# Patient Record
Sex: Male | Born: 1970 | Race: White | Hispanic: No | Marital: Single | Smoking: Current every day smoker
Health system: Southern US, Community
[De-identification: ages and names within clinical notes are randomized; demographics above are authoritative.]

## PROBLEM LIST (undated history)

## (undated) DIAGNOSIS — I10 Essential (primary) hypertension: Secondary | ICD-10-CM

## (undated) DIAGNOSIS — E7800 Pure hypercholesterolemia, unspecified: Secondary | ICD-10-CM

## (undated) DIAGNOSIS — M25559 Pain in unspecified hip: Secondary | ICD-10-CM

## (undated) HISTORY — PX: CHEST SURGERY: SHX595

---

## 2005-07-18 ENCOUNTER — Emergency Department: Payer: Self-pay | Admitting: Unknown Physician Specialty

## 2008-10-07 ENCOUNTER — Emergency Department: Payer: Self-pay | Admitting: Emergency Medicine

## 2009-04-06 ENCOUNTER — Emergency Department (HOSPITAL_BASED_OUTPATIENT_CLINIC_OR_DEPARTMENT_OTHER): Admission: EM | Admit: 2009-04-06 | Discharge: 2009-04-06 | Payer: Self-pay | Admitting: Emergency Medicine

## 2009-12-07 ENCOUNTER — Emergency Department: Payer: Self-pay | Admitting: Emergency Medicine

## 2011-04-03 ENCOUNTER — Emergency Department: Payer: Self-pay | Admitting: Emergency Medicine

## 2011-06-02 ENCOUNTER — Emergency Department: Payer: Self-pay | Admitting: Emergency Medicine

## 2015-01-01 ENCOUNTER — Ambulatory Visit (INDEPENDENT_AMBULATORY_CARE_PROVIDER_SITE_OTHER): Payer: BLUE CROSS/BLUE SHIELD

## 2015-01-01 ENCOUNTER — Ambulatory Visit
Admission: EM | Admit: 2015-01-01 | Discharge: 2015-01-01 | Disposition: A | Discharge status: Home / Self Care | Payer: BLUE CROSS/BLUE SHIELD | Attending: Emergency Medicine | Admitting: Emergency Medicine

## 2015-01-01 DIAGNOSIS — M25551 Pain in right hip: Secondary | ICD-10-CM | POA: Diagnosis not present

## 2015-01-01 MED ORDER — PREDNISONE 50 MG PO TABS: 50.0000 mg | ORAL_TABLET | Freq: Every day | ORAL | Status: DC

## 2015-01-01 NOTE — ED Notes (Signed)
C/o right posterior hip pain constant x 1 week. Non radiating. Denies trauma

## 2015-01-01 NOTE — ED Provider Notes (Signed)
CSN: [id number]     Arrival date & time 01/01/15  1624 History   None    Chief Complaint  Patient presents with  . Hip Pain   (Consider location/radiation/quality/duration/timing/severity/associated sxs/prior Treatment) HPI Onset of right hip pain for about 1 week no known injury. 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 No pertinent past medical history. Past Surgical History  Procedure Laterality Date  . Hernia repair     Family History  Problem Relation Age of Onset  . Heart failure Mother   . Cancer Father    Social History  Substance Use Topics  . Smoking status: Current Every Day Smoker -- 0.50 packs/day    Types: Cigarettes  . Smokeless tobacco: None  . Alcohol Use: Yes     Comment: socially    Review of Systems +'ve right hip pain Denies: numbness, weakness Allergies  Erythromycin  Home Medications   Prior to Admission medications   Not on File   Meds Ordered and Administered this Visit  Medications - No data to display  BP 129/91 mmHg  Pulse 92  Temp(Src) 97.3 F (36.3 C) (Tympanic)  Resp 16  Ht 5\' 10"  (1.778 m)  Wt 173 lb (78.472 kg)  BMI 24.82 kg/m2  SpO2 100% No data found.   Physical Exam  Constitutional: He appears well-developed and well-nourished.  HENT:  Head: Normocephalic and atraumatic.  Pulmonary/Chest: Effort normal.  Musculoskeletal:       Right hip: He exhibits decreased range of motion.       Legs: Neurological: He is alert.    ED Course  Procedures (including critical care time)  Labs Review Labs Reviewed - No data to display  Imaging Review Dg Hip Unilat With Pelvis 2-3 Views Right  01/01/2015  CLINICAL DATA:  Right posterior hip pain for 1 week EXAM: DG HIP (WITH OR WITHOUT PELVIS) 2-3V RIGHT COMPARISON:  None. FINDINGS: There is no evidence of hip fracture or dislocation. There is no evidence of arthropathy or other focal bone abnormality. IMPRESSION: No acute osseous injury of the right hip.  Electronically Signed   By: Elige KoHetal  Patel   On: 01/01/2015 18:06     Visual Acuity Review  Right Eye Distance:   Left Eye Distance:   Bilateral Distance:    Right Eye Near:   Left Eye Near:    Bilateral Near:         MDM   1. Hip pain, acute, right    Treat with antiinflammatory  Follow up with his doctor  Return to work Monday    Tharon AquasFrank C Mordche Hedglin, [place] 01/01/15 2031

## 2015-01-01 NOTE — Discharge Instructions (Signed)
Heat Therapy °Heat therapy can help ease sore, stiff, injured, and tight muscles and joints. Heat relaxes your muscles, which may help ease your pain. Heat therapy should only be used on old, pre-existing, or long-lasting (chronic) injuries. Do not use heat therapy unless told by your doctor. °HOW TO USE HEAT THERAPY °There are several different kinds of heat therapy, including: °· Moist heat pack. °· Warm water bath. °· Hot water bottle. °· Electric heating pad. °· Heated gel pack. °· Heated wrap. °· Electric heating pad. °GENERAL HEAT THERAPY RECOMMENDATIONS  °· Do not sleep while using heat therapy. Only use heat therapy while you are awake. °· Your skin may turn pink while using heat therapy. Do not use heat therapy if your skin turns red. °· Do not use heat therapy if you have new pain. °· High heat or long exposure to heat can cause burns. Be careful when using heat therapy to avoid burning your skin. °· Do not use heat therapy on areas of your skin that are already irritated, such as with a rash or sunburn. °GET HELP IF:  °· You have blisters, redness, swelling (puffiness), or numbness. °· You have new pain. °· Your pain is worse. °MAKE SURE YOU: °· Understand these instructions. °· Will watch your condition. °· Will get help right away if you are not doing well or get worse. °  °This information is not intended to replace advice given to you by your health care provider. Make sure you discuss any questions you have with your health care provider. °  °Document Released: 04/11/2011 Document Revised: 02/07/2014 Document Reviewed: 03/12/2013 °Elsevier Interactive Patient Education ©2016 [employer]vier Inc. ° °

## 2015-01-06 ENCOUNTER — Ambulatory Visit
Admission: EM | Admit: 2015-01-06 | Discharge: 2015-01-06 | Disposition: A | Discharge status: Home / Self Care | Payer: BLUE CROSS/BLUE SHIELD | Attending: Family Medicine | Admitting: Family Medicine

## 2015-01-06 ENCOUNTER — Encounter: Payer: Self-pay | Admitting: Emergency Medicine

## 2015-01-06 DIAGNOSIS — T889XXA Complication of surgical and medical care, unspecified, initial encounter: Secondary | ICD-10-CM | POA: Diagnosis not present

## 2015-01-06 DIAGNOSIS — R110 Nausea: Secondary | ICD-10-CM

## 2015-01-06 DIAGNOSIS — R062 Wheezing: Secondary | ICD-10-CM | POA: Diagnosis not present

## 2015-01-06 DIAGNOSIS — R0602 Shortness of breath: Secondary | ICD-10-CM | POA: Diagnosis not present

## 2015-01-06 DIAGNOSIS — M25551 Pain in right hip: Secondary | ICD-10-CM

## 2015-01-06 LAB — BASIC METABOLIC PANEL
ANION GAP: 9 (ref 5–15)
BUN: 7 mg/dL (ref 6–20)
CO2: 28 mmol/L (ref 22–32)
Calcium: 9.7 mg/dL (ref 8.9–10.3)
Chloride: 99 mmol/L — ABNORMAL LOW (ref 101–111)
Creatinine, Ser: 0.77 mg/dL (ref 0.61–1.24)
GFR calc Af Amer: >60 mL/min (ref >60)
GLUCOSE: 89 mg/dL (ref 65–99)
POTASSIUM: 3.7 mmol/L (ref 3.5–5.1)
Sodium: 136 mmol/L (ref 135–145)

## 2015-01-06 MED ORDER — IPRATROPIUM-ALBUTEROL 0.5-2.5 (3) MG/3ML IN SOLN
3.0000 mL | Freq: Once | RESPIRATORY_TRACT | Status: AC
  Administered 2015-01-06: 3 mL via RESPIRATORY_TRACT

## 2015-01-06 MED ORDER — ONDANSETRON 8 MG PO TBDP: 8.0000 mg | ORAL_TABLET | Freq: Two times a day (BID) | ORAL | Status: DC

## 2015-01-06 MED ORDER — HYDROCODONE-ACETAMINOPHEN 5-325 MG PO TABS: 1.0000 | ORAL_TABLET | Freq: Four times a day (QID) | ORAL | Status: DC | PRN

## 2015-01-06 NOTE — ED Notes (Signed)
Patient c/o difficulty breathing that started couple of days ago that has gotten worse after starting Prednisone.

## 2015-01-06 NOTE — ED Provider Notes (Signed)
CSN: [id number]     Arrival date & time 01/06/15  1829 History   First MD Initiated Contact with Patient 01/06/15 1840     Chief Complaint  Patient presents with  . Shortness of Breath  . Medication Reaction   (Consider location/radiation/quality/duration/timing/severity/associated sxs/prior Treatment) HPI Comments: 44 yo male with a c/o a 4 days h/o fatigue, insomnia, nausea, shortness of breath since starting prednisone 50mg  daily for right hip pain. States took last dose of prednisone today in the am. Denies any rash or swelling.   The history is provided by the patient.    History reviewed. No pertinent past medical history. Past Surgical History  Procedure Laterality Date  . Hernia repair     Family History  Problem Relation Age of Onset  . Heart failure Mother   . Cancer Father    Social History  Substance Use Topics  . Smoking status: Current Every Day Smoker -- 0.50 packs/day    Types: Cigarettes  . Smokeless tobacco: None  . Alcohol Use: Yes     Comment: socially    Review of Systems  Allergies  Erythromycin  Home Medications   Prior to Admission medications   Medication Sig Start Date End Date Taking? Authorizing Provider  HYDROcodone-acetaminophen (NORCO/VICODIN) 5-325 MG tablet Take 1 tablet by mouth every 6 (six) hours as needed. 01/06/15   Payton Mccallumrlando Niclas Markell, MD  ondansetron (ZOFRAN ODT) 8 MG disintegrating tablet Take 1 tablet (8 mg total) by mouth 2 (two) times daily. 01/06/15   Payton Mccallumrlando Makeda Peeks, MD  predniSONE (DELTASONE) 50 MG tablet Take 1 tablet (50 mg total) by mouth daily. 01/01/15   Tharon AquasFrank C Patrick, PA   Meds Ordered and Administered this Visit   Medications  ipratropium-albuterol (DUONEB) 0.5-2.5 (3) MG/3ML nebulizer solution 3 mL (3 mLs Nebulization Given 01/06/15 1908)    BP 137/95 mmHg  Pulse 93  Temp(Src) 97.7 F (36.5 C) (Tympanic)  Resp 16  Ht 5\' 10"  (1.778 m)  Wt 173 lb (78.472 kg)  BMI 24.82 kg/m2  SpO2 99% No data found.   Physical  Exam  Constitutional: He appears well-developed and well-nourished. No distress.  HENT:  Head: Normocephalic and atraumatic.  Right Ear: Tympanic membrane, external ear and ear canal normal.  Left Ear: Tympanic membrane, external ear and ear canal normal.  Nose: Nose normal.  Mouth/Throat: Uvula is midline, oropharynx is clear and moist and mucous membranes are normal. No oropharyngeal exudate or tonsillar abscesses.  Eyes: Conjunctivae and EOM are normal. Pupils are equal, round, and reactive to light. Right eye exhibits no discharge. Left eye exhibits no discharge. No scleral icterus.  Neck: Normal range of motion. Neck supple. No tracheal deviation present. No thyromegaly present.  Cardiovascular: Normal rate, regular rhythm and normal heart sounds.   Pulmonary/Chest: Effort normal. No stridor. No respiratory distress. He has wheezes (few expiratory wheezes). He has no rales. He exhibits no tenderness.  Musculoskeletal: He exhibits no edema.  Lymphadenopathy:    He has no cervical adenopathy.  Neurological: He is alert.  Skin: Skin is warm and dry. No rash noted. He is not diaphoretic.  Nursing note and vitals reviewed.   ED Course  Procedures (including critical care time)  Labs Review Labs Reviewed  BASIC METABOLIC PANEL - Abnormal; Notable for the following:    Chloride 99 (*)    All other components within normal limits    Imaging Review No results found.   Visual Acuity Review  Right Eye Distance:   Left Eye  Distance:   Bilateral Distance:    Right Eye Near:   Left Eye Near:    Bilateral Near:      EKG: normal EKG, normal sinus rhythm, there are no previous tracings available for comparison; reviewed by me and agree with readout.  MDM   1. Nausea   2. Side effects of treatment, initial encounter   3. Hip pain, right    Discharge Medication List as of 01/06/2015  7:55 PM    START taking these medications   Details  HYDROcodone-acetaminophen  (NORCO/VICODIN) 5-325 MG tablet Take 1 tablet by mouth every 6 (six) hours as needed., Starting 01/06/2015, Until Discontinued, Print    ondansetron (ZOFRAN ODT) 8 MG disintegrating tablet Take 1 tablet (8 mg total) by mouth 2 (two) times daily., Starting 01/06/2015, Until Discontinued, Normal       1. Lab, EKG results and diagnosis reviewed with patient  2. Patient given Duoneb treatment with improvement of symptoms 3. rx as per orders above; reviewed possible side effects, interactions, risks and benefits  4. Recommend supportive treatment with increased fluids; stop prednisone 5. Follow-up prn if symptoms worsen or don't improve    Payton Mccallum, MD 01/06/15 2028

## 2015-04-06 ENCOUNTER — Ambulatory Visit
Admission: EM | Admit: 2015-04-06 | Discharge: 2015-04-06 | Disposition: A | Discharge status: Home / Self Care | Payer: BLUE CROSS/BLUE SHIELD | Attending: Family Medicine | Admitting: Family Medicine

## 2015-04-06 DIAGNOSIS — K529 Noninfective gastroenteritis and colitis, unspecified: Secondary | ICD-10-CM | POA: Diagnosis not present

## 2015-04-06 HISTORY — DX: Pain in unspecified hip: M25.559

## 2015-04-06 LAB — COMPREHENSIVE METABOLIC PANEL
ALT: 39 U/L (ref 17–63)
AST: 28 U/L (ref 15–41)
Albumin: 4 g/dL (ref 3.5–5.0)
Alkaline Phosphatase: 111 U/L (ref 38–126)
Anion gap: 6 (ref 5–15)
BUN: 7 mg/dL (ref 6–20)
CHLORIDE: 102 mmol/L (ref 101–111)
CO2: 26 mmol/L (ref 22–32)
CREATININE: 0.73 mg/dL (ref 0.61–1.24)
Calcium: 8.4 mg/dL — ABNORMAL LOW (ref 8.9–10.3)
GFR calc Af Amer: >60 mL/min (ref >60)
GFR calc non Af Amer: >60 mL/min (ref >60)
Glucose, Bld: 97 mg/dL (ref 65–99)
POTASSIUM: 3.8 mmol/L (ref 3.5–5.1)
SODIUM: 134 mmol/L — AB (ref 135–145)
Total Bilirubin: 0.6 mg/dL (ref 0.3–1.2)
Total Protein: 6.8 g/dL (ref 6.5–8.1)

## 2015-04-06 LAB — CBC WITH DIFFERENTIAL/PLATELET
BASOS ABS: 0 10*3/uL (ref 0–0.1)
BASOS PCT: 0 %
EOS ABS: 0.2 10*3/uL (ref 0–0.7)
EOS PCT: 3 %
HCT: 43 % (ref 40.0–52.0)
Hemoglobin: 14.5 g/dL (ref 13.0–18.0)
Lymphocytes Relative: 36 %
Lymphs Abs: 2.5 10*3/uL (ref 1.0–3.6)
MCH: 30.5 pg (ref 26.0–34.0)
MCHC: 33.8 g/dL (ref 32.0–36.0)
MCV: 90.2 fL (ref 80.0–100.0)
Monocytes Absolute: 0.6 10*3/uL (ref 0.2–1.0)
Monocytes Relative: 9 %
Neutro Abs: 3.7 10*3/uL (ref 1.4–6.5)
Neutrophils Relative %: 52 %
PLATELETS: 238 10*3/uL (ref 150–440)
RBC: 4.76 MIL/uL (ref 4.40–5.90)
RDW: 13.5 % (ref 11.5–14.5)
WBC: 7 10*3/uL (ref 3.8–10.6)

## 2015-04-06 MED ORDER — ONDANSETRON 8 MG PO TBDP: 8.0000 mg | ORAL_TABLET | Freq: Three times a day (TID) | ORAL | Status: DC | PRN

## 2015-04-06 NOTE — ED Notes (Signed)
Started Saturday afternoon "feeling crampy". + diarrhea started. + nausea but no vomiting. Also states has pain under umbilical area and "my whole body hurts"

## 2015-04-06 NOTE — ED Provider Notes (Signed)
CSN: [id number]     Arrival date & time 04/06/15  1044 History   First MD Initiated Contact with Patient 04/06/15 1128     Chief Complaint  Patient presents with  . Diarrhea  . Abdominal Pain   (Consider location/radiation/quality/duration/timing/severity/associated sxs/prior Treatment) HPI Comments: 45 yo male with a 2 days h/o crampy abdominal pains, diarrhea and nausea. Denies any vomiting, fevers, chills, melena, hematochezia, urinary symptoms, suspicious food intake or known sick contacts.   Patient is a 45 y.o. male presenting with diarrhea and abdominal pain. The history is provided by the patient.  Diarrhea Associated symptoms: abdominal pain   Abdominal Pain Associated symptoms: diarrhea     Past Medical History  Diagnosis Date  . Hip pain     right   Past Surgical History  Procedure Laterality Date  . Hernia repair     Family History  Problem Relation Age of Onset  . Heart failure Mother   . Cancer Father    Social History  Substance Use Topics  . Smoking status: Current Some Day Smoker -- 0.50 packs/day    Types: Cigarettes  . Smokeless tobacco: None  . Alcohol Use: Yes     Comment: socially    Review of Systems  Gastrointestinal: Positive for abdominal pain and diarrhea.    Allergies  Prednisone and Erythromycin  Home Medications   Prior to Admission medications   Medication Sig Start Date End Date Taking? Authorizing Provider  HYDROcodone-acetaminophen (NORCO/VICODIN) 5-325 MG tablet Take 1 tablet by mouth every 6 (six) hours as needed. 01/06/15   Norval Gable, MD  ondansetron (ZOFRAN ODT) 8 MG disintegrating tablet Take 1 tablet (8 mg total) by mouth every 8 (eight) hours as needed for nausea or vomiting. 04/06/15   Norval Gable, MD  predniSONE (DELTASONE) 50 MG tablet Take 1 tablet (50 mg total) by mouth daily. 01/01/15   Konrad Felix, PA   Meds Ordered and Administered this Visit  Medications - No data to display  BP 129/85 mmHg  Pulse 70   Temp(Src) 98 F (36.7 C) (Oral)  Resp 16  Ht _0  (1.778 m)  Wt 170 lb (77.111 kg)  BMI 24.39 kg/m2  SpO2 100% Orthostatic VS for the past 24 hrs:  BP- Lying Pulse- Lying BP- Sitting Pulse- Sitting BP- Standing at 0 minutes Pulse- Standing at 0 minutes  04/06/15 1108 129/85 mmHg 70 119/85 mmHg 70 121/86 mmHg 80    Physical Exam  Constitutional: He appears well-developed and well-nourished. No distress.  Cardiovascular: Normal rate, regular rhythm and normal heart sounds.   Pulmonary/Chest: Effort normal and breath sounds normal. No respiratory distress. He has no wheezes. He has no rales.  Abdominal: Soft. Bowel sounds are normal. He exhibits no distension and no mass. There is tenderness (mild diffuse tenderness to palpation; no rebound or guarding). There is no rebound and no guarding.  Skin: No rash noted. He is not diaphoretic.  Nursing note and vitals reviewed.   ED Course  Procedures (including critical care time)  Labs Review Labs Reviewed  COMPREHENSIVE METABOLIC PANEL - Abnormal; Notable for the following:    Sodium 134 (*)    Calcium 8.4 (*)    All other components within normal limits  GASTROINTESTINAL PANEL BY PCR, STOOL (REPLACES STOOL CULTURE)  CBC WITH DIFFERENTIAL/PLATELET    Imaging Review No results found.   Visual Acuity Review  Right Eye Distance:   Left Eye Distance:   Bilateral Distance:    Right Eye Near:  Left Eye Near:    Bilateral Near:         MDM   1. Acute gastroenteritis   (likely viral)    Discharge Medication List as of 04/06/2015 12:41 PM     Discharge Medication List as of 04/06/2015 12:41 PM     1. Lab results and diagnosis reviewed with patient; patient tolerating po fluids prior to discharge 2. rx as per orders above; reviewed possible side effects, interactions, risks and benefits; rx printed for zofran as per orders 3. Recommend supportive treatment with increased fluids/clear liquids, then advance slowly as  tolerated 4. Check stool sample (patient given kit to bring back samples) 5. Follow-up prn if symptoms worsen or don't improve   Norval Gable, MD 04/06/15 1614

## 2015-08-11 ENCOUNTER — Ambulatory Visit (INDEPENDENT_AMBULATORY_CARE_PROVIDER_SITE_OTHER): Payer: BLUE CROSS/BLUE SHIELD

## 2015-08-11 ENCOUNTER — Ambulatory Visit (INDEPENDENT_AMBULATORY_CARE_PROVIDER_SITE_OTHER): Payer: BLUE CROSS/BLUE SHIELD | Admitting: Family Medicine

## 2015-08-11 ENCOUNTER — Encounter: Payer: Self-pay | Admitting: Family Medicine

## 2015-08-11 VITALS — BP 122/78 | HR 75 | Temp 98.2°F | Wt 185.0 lb

## 2015-08-11 DIAGNOSIS — M25511 Pain in right shoulder: Secondary | ICD-10-CM

## 2015-08-11 MED ORDER — DICLOFENAC SODIUM 75 MG PO TBEC: 75.0000 mg | DELAYED_RELEASE_TABLET | Freq: Two times a day (BID) | ORAL | Status: DC

## 2015-08-11 NOTE — Progress Notes (Signed)
Subjective:  Patient ID: Corey CowmanWilliam S Kramme, male    DOB: [birth year]  Age: 45 y.o. MRN: [id number]  CC: R shoulder pain  HPI Corey CowmanWilliam S Altman is a 45 y.o. male presents to the clinic today as a new patient with the above complaint.  Right shoulder pain  Patient suffered a fall on Saturday.  Patient was pulled off a deck by his dog.   He subsequently fell down and landed on his right shoulder.  He developed pain as well as tingling in his hand.  [facility]  His emergency department visit was reviewed and is summarized as follows: Patient evaluated and x-rays obtained. Initial x-ray the shoulder was suggestive of an AC joint injury. Given findings on x-ray, orthopedics was consult did and further x-rays were done focusing [facility]. These x-rays were negative. He was discharged home after being placed in a sling and told to follow-up.  Patient presents today with continued complaints of right shoulder pain. Is been taking Tylenol for the pain.  Exacerbated by certain movements.  He has had some relief with Tylenol and rest.  He continues to have numbness in hands.  No other complaints at this time.  PMH, Surgical Hx, Family Hx, Social History reviewed and updated as below.  PMH - None per report.  Past Surgical History  Procedure Laterality Date  . Chest surgery      Reports removal of a "growth" or repair of a hernia in the chest as a child   Family History  Problem Relation Age of Onset  . Heart failure Mother   . Cancer Father   . Diabetes Paternal Grandmother    Social History  Substance Use Topics  . Smoking status: Current Every Day Smoker -- 0.50 packs/day    Types: Cigarettes  . Smokeless tobacco: Not on file  . Alcohol Use: 0.0 oz/week    0 Standard drinks or equivalent per week     Comment: 1x/month   Review of Systems  Musculoskeletal:       R shoulder pain.  All other systems reviewed and are negative.  Objective:   Today's  Vitals: BP 122/78 mmHg  Pulse 75  Temp(Src) 98.2 F (36.8 C) (Oral)  Wt 185 lb (83.915 kg)  SpO2 97%  Physical Exam  Constitutional: He is oriented to person, place, and time. He appears well-developed. No distress.  HENT:  Mouth/Throat: Oropharynx is clear and moist.  Poor dentition. Numerous caries and missing teeth.  Eyes: Conjunctivae are normal. Scleral icterus is present.  Neck: Normal range of motion.  Cardiovascular: Normal rate and regular rhythm.   Pulmonary/Chest: Effort normal. He has no wheezes. He has no rales.  Abdominal: Soft. He exhibits no distension. There is no tenderness.  Musculoskeletal:  Shoulder: Inspection reveals no abnormalities, atrophy or asymmetry. Palpation - tenderness over AC joint.  Decreased ROM due to pain.    Neurological: He is alert and oriented to person, place, and time.  Skin: Skin is warm and dry. No rash noted.  Psychiatric: He has a normal mood and affect.  Vitals reviewed.  Assessment & Plan:   Problem List Items Addressed This Visit    Right shoulder pain - Primary    New problem. I discussed this case with a colleague (Dr. Margaretha Sheffieldraper, Sports medicine). Given the initial x-ray results that were [facility] of [facility] and tenderness over the [facility] joint today, will repeat xrays to ensure no occult fracture. Treating  with Diclofenac. Out of work x 1 week. May need more given significant lifting at work.      Relevant Orders   DG Shoulder Right   DG Clavicle Right     Outpatient Encounter Prescriptions as of 08/11/2015  Medication Sig  . diclofenac (VOLTAREN) 75 MG EC tablet Take 1 tablet (75 mg total) by mouth 2 (two) times daily.  . [DISCONTINUED] HYDROcodone-acetaminophen (NORCO/VICODIN) 5-325 MG tablet Take 1 tablet by mouth every 6 (six) hours as needed.  . [DISCONTINUED] ondansetron (ZOFRAN ODT) 8 MG disintegrating tablet Take 1 tablet (8 mg total) by mouth every 8 (eight) hours as needed for nausea or vomiting.  .  [DISCONTINUED] predniSONE (DELTASONE) 50 MG tablet Take 1 tablet (50 mg total) by mouth daily.   No facility-administered encounter medications on file as of 08/11/2015.   Follow-up: PRN  Everlene Other DO [facility]

## 2015-08-11 NOTE — Progress Notes (Signed)
Pre visit review using our clinic review tool, if applicable. No additional management support is needed unless otherwise documented below in the visit note. 

## 2015-08-12 DIAGNOSIS — M25511 Pain in right shoulder: Secondary | ICD-10-CM | POA: Insufficient documentation

## 2015-08-12 NOTE — Assessment & Plan Note (Signed)
New problem. I discussed this case with a colleague (Dr. Margaretha Sheffieldraper, Sports medicine). Given the initial x-ray results that were suggestive of an [facility] joint injury and tenderness over the [facility] joint today, will repeat xrays to ensure no occult fracture. Treating with Diclofenac. Out of work x 1 week. May need more given significant lifting at work.

## 2015-08-13 ENCOUNTER — Encounter: Payer: Self-pay | Admitting: Family Medicine

## 2015-08-13 ENCOUNTER — Telehealth: Payer: Self-pay | Admitting: Family Medicine

## 2015-08-13 ENCOUNTER — Ambulatory Visit (INDEPENDENT_AMBULATORY_CARE_PROVIDER_SITE_OTHER): Payer: BLUE CROSS/BLUE SHIELD | Admitting: Family Medicine

## 2015-08-13 VITALS — BP 122/88 | HR 74 | Temp 98.4°F | Wt 187.4 lb

## 2015-08-13 DIAGNOSIS — K625 Hemorrhage of anus and rectum: Secondary | ICD-10-CM | POA: Diagnosis not present

## 2015-08-13 NOTE — Progress Notes (Signed)
   Subjective:  Patient ID: Corey Barnes, male    DOB: [birth year]  Age: 45 y.o. MRN: [id number]  CC: Blood with wiping  HPI:  45 year old male presents with the above complaints.  Patient was recently seen on 7/11 and was placed on diclofenac. He states that earlier today he noticed some blood with wiping no reports of blood in the toilet. No associated rectal pain. Denies issues with constipation. Has bowel movement every 2 days without significant straining. No prior history of hemorrhoids. Patient states that he "had a decent amount of blood" on the toilet tissue. No associated abdominal pain. No reports of melena. No known inciting factor, although the patient is concerned that this was from the diclofenac. No known exacerbating or relieving factors. He's had no further issues today.  Social Hx   Social History   Social History  . Marital Status: Single    Spouse Name: N/A  . Number of Children: N/A  . Years of Education: N/A   Social History Main Topics  . Smoking status: Current Every Day Smoker -- 0.50 packs/day    Types: Cigarettes  . Smokeless tobacco: None  . Alcohol Use: 0.0 oz/week    0 Standard drinks or equivalent per week     Comment: 1x/month  . Drug Use: No  . Sexual Activity:    Partners: Female     Comment: Girlfriend   Other Topics Concern  . None   Social History Narrative   Review of Systems  Constitutional: Negative.   Gastrointestinal:       Blood with wiping.    Objective:  BP 122/88 mmHg  Pulse 74  Temp(Src) 98.4 F (36.9 C) (Oral)  Wt 187 lb 6 oz (84.993 kg)  SpO2 98%  BP/Weight 08/13/2015 3/90/3009 03/05/3005  Systolic BP 622 633 354  Diastolic BP 88 78 85  Wt. (Lbs) 187.38 185 170  BMI 26.89 26.54 24.39   Physical Exam  Constitutional: He is oriented to person, place, and time. He appears well-developed. No distress.  Abdominal: Soft. He exhibits no distension. There is no tenderness. There is no rebound and no guarding.    Genitourinary: Rectal exam shows no external hemorrhoid, no fissure and no mass.  Neurological: He is alert and oriented to person, place, and time.  Psychiatric: He has a normal mood and affect.  Vitals reviewed.  Lab Results  Component Value Date   WBC 7.0 04/06/2015   HGB 14.5 04/06/2015   HCT 43.0 04/06/2015   PLT 238 04/06/2015   GLUCOSE 97 04/06/2015   ALT 39 04/06/2015   AST 28 04/06/2015   NA 134* 04/06/2015   K 3.8 04/06/2015   CL 102 04/06/2015   CREATININE 0.73 04/06/2015   BUN 7 04/06/2015   CO2 26 04/06/2015   Assessment & Plan:   Problem List Items Addressed This Visit    BRBPR (bright red blood per rectum) - Primary    New problem. 1 episode of blood on toilet tissue. Exam unremarkable. CBC today.  Sending home with FOBT kit. Stopping Diclofenac.      Relevant Orders   CBC   Fecal occult blood, imunochemical     Follow-up: PRN  [place]

## 2015-08-13 NOTE — Telephone Encounter (Signed)
On your scheduled this afternoon, thanks

## 2015-08-13 NOTE — Patient Instructions (Signed)
Stop the medication.  We will call with the results of the lab.  Follow up as needed  Take care  Dr. Adriana Simasook

## 2015-08-13 NOTE — Telephone Encounter (Signed)
Pt was spoke with and PCP has discontinued medication prescribed recently. Pt is still coming in for a visit.

## 2015-08-13 NOTE — Telephone Encounter (Signed)
Patient Name: Gus RankinWILLIAM Wurtzel DOB: 08/29/1970 Initial Comment Caller says he was seen on the 11th, Dx shoulder injury, he noticed he had blood in his stool this morning, which is a known side effect for a new Dilofenac Rx he is taking Nurse Assessment Nurse: Roma KayserForsythe, RN, Santina Evansatherine Date/Time (Eastern Time): 08/13/2015 10:02:48 AM Confirm and document reason for call. If symptomatic, describe symptoms. You must click the next button to save text entered. ---caller states blood in his stool, once , toilet paper had bright red blood. Has the patient traveled out of the country within the last 30 days? ---No Does the patient have any new or worsening symptoms? ---Yes Will a triage be completed? ---Yes Related visit to physician within the last 2 weeks? ---Yes Does the PT have any chronic conditions? (i.e. diabetes, asthma, etc.) ---No Is this a behavioral health or substance abuse call? ---No Guidelines Guideline Title Affirmed Question Affirmed Notes Rectal Bleeding MILD rectal bleeding (more than just a few drops or streaks) Final Disposition User See PCP When Office is Open (within 3 days) Roma KayserForsythe, RN, Walt DisneyCatherine Referrals REFERRED TO PCP OFFICE Disagree/Comply: Danella Maiersomply

## 2015-08-13 NOTE — Progress Notes (Signed)
Pre visit review using our clinic review tool, if applicable. No additional management support is needed unless otherwise documented below in the visit note. 

## 2015-08-13 NOTE — Assessment & Plan Note (Signed)
New problem. 1 episode of blood on toilet tissue. Exam unremarkable. CBC today.  Sending home with FOBT kit. Stopping Diclofenac.

## 2015-08-14 LAB — CBC
HCT: 42.3 % (ref 39.0–52.0)
HEMOGLOBIN: 14.5 g/dL (ref 13.0–17.0)
MCHC: 34.2 g/dL (ref 30.0–36.0)
MCV: 89.9 fl (ref 78.0–100.0)
PLATELETS: 294 10*3/uL (ref 150.0–400.0)
RBC: 4.71 Mil/uL (ref 4.22–5.81)
RDW: 13.1 % (ref 11.5–15.5)
WBC: 7.9 10*3/uL (ref 4.0–10.5)

## 2015-09-07 ENCOUNTER — Ambulatory Visit (INDEPENDENT_AMBULATORY_CARE_PROVIDER_SITE_OTHER): Payer: BLUE CROSS/BLUE SHIELD | Admitting: Family Medicine

## 2015-09-07 ENCOUNTER — Encounter: Payer: Self-pay | Admitting: Family Medicine

## 2015-09-07 DIAGNOSIS — M25511 Pain in right shoulder: Secondary | ICD-10-CM

## 2015-09-07 MED ORDER — MELOXICAM 15 MG PO TABS: 15.0000 mg | ORAL_TABLET | Freq: Every day | ORAL | 0 refills | Status: AC | PRN

## 2015-09-07 NOTE — Patient Instructions (Signed)
Use the medication daily as needed.  Consider injection and physical therapy.  Follow up annually or sooner if needed  Take care  Dr. Adriana Simasook

## 2015-09-07 NOTE — Progress Notes (Signed)
   Subjective:  Patient ID: Corey CowmanWilliam S Barnes, male    DOB: 1970-04-29  Age: 45 y.o. MRN: [id number]  CC: R Shoulder pain  HPI:  45 year old male presents with continued right shoulder pain.  Patient has been seen previously for this issue. His pain initially started after a fall (see prior notes).  Patient states that he continues to have right shoulder pain. He states that it often pops and crutches. Exacerbated by certain movements. No relieving factors. No medications or interventions tried recently (took voltaren briefly).  No other recent injury. No other complaints today.  Social Hx   Social History   Social History  . Marital status: Single    Spouse name: N/A  . Number of children: N/A  . Years of education: N/A   Social History Main Topics  . Smoking status: Current Every Day Smoker    Packs/day: 0.50    Types: Cigarettes  . Smokeless tobacco: None  . Alcohol use 0.0 oz/week     Comment: 1x/month  . Drug use: No  . Sexual activity: Yes    Partners: Female     Comment: Girlfriend   Other Topics Concern  . None   Social History Narrative  . None   Review of Systems  Constitutional: Negative.   Musculoskeletal:       R shoulder pain.   Objective:  BP 124/88 (BP Location: Right Arm, Patient Position: Sitting, Cuff Size: Normal)   Pulse 90   Temp 98.1 F (36.7 C) (Oral)   Wt 186 lb 6 oz (84.5 kg)   SpO2 97%   BMI 26.74 kg/m   BP/Weight 09/07/2015 08/13/2015 08/11/2015  Systolic BP 124 122 122  Diastolic BP 88 88 78  Wt. (Lbs) 186.38 187.38 185  BMI 26.74 26.89 26.54   Physical Exam  Constitutional: He is oriented to person, place, and time. He appears well-developed. No distress.  Cardiovascular: Normal rate and regular rhythm.   Pulmonary/Chest: Effort normal and breath sounds normal.  Musculoskeletal:  Shoulder: Right Inspection reveals no abnormalities, atrophy or asymmetry. Palpation with mild tenderness over bicipital groove. ROM is full in  all planes. Rotator cuff strength normal throughout. ? Positive Hawkin's.  No painful arc and no drop arm sign. No apprehension sign.  Neurological: He is alert and oriented to person, place, and time.  Psychiatric: He has a normal mood and affect.  Vitals reviewed.  Lab Results  Component Value Date   WBC 7.9 08/13/2015   HGB 14.5 08/13/2015   HCT 42.3 08/13/2015   PLT 294.0 08/13/2015   GLUCOSE 97 04/06/2015   ALT 39 04/06/2015   AST 28 04/06/2015   NA 134 (L) 04/06/2015   K 3.8 04/06/2015   CL 102 04/06/2015   CREATININE 0.73 04/06/2015   BUN 7 04/06/2015   CO2 26 04/06/2015    Assessment & Plan:   Problem List Items Addressed This Visit    Right shoulder pain    Established problem, worsening/unimproved. Offered injection versus medication today. Patient elected for medication. Treating with Mobic.       Other Visit Diagnoses   None.     Meds ordered this encounter  Medications  . meloxicam (MOBIC) 15 MG tablet    Sig: Take 1 tablet (15 mg total) by mouth daily as needed for pain.    Dispense:  30 tablet    Refill:  0    Follow-up: PRN  Everlene OtherJayce Allecia Bells DO [facility] [place]

## 2015-09-07 NOTE — Assessment & Plan Note (Signed)
Established problem, worsening/unimproved. Offered injection versus medication today. Patient elected for medication. Treating with Mobic.

## 2015-09-07 NOTE — Progress Notes (Signed)
Pre visit review using our clinic review tool, if applicable. No additional management support is needed unless otherwise documented below in the visit note. 

## 2016-03-30 ENCOUNTER — Encounter: Payer: Self-pay | Admitting: Emergency Medicine

## 2016-03-30 ENCOUNTER — Emergency Department: Payer: BLUE CROSS/BLUE SHIELD

## 2016-03-30 DIAGNOSIS — F17210 Nicotine dependence, cigarettes, uncomplicated: Secondary | ICD-10-CM | POA: Insufficient documentation

## 2016-03-30 DIAGNOSIS — R0789 Other chest pain: Secondary | ICD-10-CM | POA: Diagnosis present

## 2016-03-30 DIAGNOSIS — J40 Bronchitis, not specified as acute or chronic: Secondary | ICD-10-CM | POA: Diagnosis not present

## 2016-03-30 LAB — CBC
HCT: 43.4 % (ref 40.0–52.0)
Hemoglobin: 15.4 g/dL (ref 13.0–18.0)
MCH: 31.3 pg (ref 26.0–34.0)
MCHC: 35.5 g/dL (ref 32.0–36.0)
MCV: 88.3 fL (ref 80.0–100.0)
PLATELETS: 333 10*3/uL (ref 150–440)
RBC: 4.92 MIL/uL (ref 4.40–5.90)
RDW: 13.3 % (ref 11.5–14.5)
WBC: 10.5 10*3/uL (ref 3.8–10.6)

## 2016-03-30 LAB — BASIC METABOLIC PANEL
ANION GAP: 11 (ref 5–15)
BUN: 10 mg/dL (ref 6–20)
CALCIUM: 10 mg/dL (ref 8.9–10.3)
CO2: 22 mmol/L (ref 22–32)
CREATININE: 0.93 mg/dL (ref 0.61–1.24)
Chloride: 104 mmol/L (ref 101–111)
GFR calc Af Amer: >60 mL/min (ref >60)
GLUCOSE: 104 mg/dL — AB (ref 65–99)
Potassium: 3.5 mmol/L (ref 3.5–5.1)
Sodium: 137 mmol/L (ref 135–145)

## 2016-03-30 LAB — TROPONIN I

## 2016-03-30 NOTE — ED Triage Notes (Addendum)
Pt to triage via w/c, appears very anxious, hyperventilating; Pt reports right sided and mid CP x accomp by nausea; denies hx of same; st recent sinus congestion, took "an old amoxicillin tonight"

## 2016-03-31 ENCOUNTER — Emergency Department
Admission: EM | Admit: 2016-03-31 | Discharge: 2016-03-31 | Disposition: A | Discharge status: Home / Self Care | Payer: BLUE CROSS/BLUE SHIELD | Attending: Emergency Medicine | Admitting: Emergency Medicine

## 2016-03-31 DIAGNOSIS — J40 Bronchitis, not specified as acute or chronic: Secondary | ICD-10-CM

## 2016-03-31 DIAGNOSIS — R079 Chest pain, unspecified: Secondary | ICD-10-CM

## 2016-03-31 LAB — HEPATIC FUNCTION PANEL
ALK PHOS: 110 U/L (ref 38–126)
ALT: 45 U/L (ref 17–63)
AST: 29 U/L (ref 15–41)
Albumin: 4 g/dL (ref 3.5–5.0)
Bilirubin, Direct: <0.1 mg/dL — ABNORMAL LOW (ref 0.1–0.5)
Total Bilirubin: 0.8 mg/dL (ref 0.3–1.2)
Total Protein: 7.5 g/dL (ref 6.5–8.1)

## 2016-03-31 LAB — TROPONIN I: Troponin I: <0.03 ng/mL (ref <0.03)

## 2016-03-31 LAB — LIPASE, BLOOD: LIPASE: 33 U/L (ref 11–51)

## 2016-03-31 MED ORDER — ALBUTEROL SULFATE HFA 108 (90 BASE) MCG/ACT IN AERS: 2.0000 | INHALATION_SPRAY | Freq: Four times a day (QID) | RESPIRATORY_TRACT | 0 refills | Status: AC | PRN

## 2016-03-31 MED ORDER — IPRATROPIUM-ALBUTEROL 0.5-2.5 (3) MG/3ML IN SOLN
3.0000 mL | Freq: Once | RESPIRATORY_TRACT | Status: AC
  Administered 2016-03-31: 3 mL via RESPIRATORY_TRACT
  Filled 2016-03-31: qty 3

## 2016-03-31 MED ORDER — DEXAMETHASONE 10 MG/ML FOR PEDIATRIC ORAL USE
10.0000 mg | Freq: Once | INTRAMUSCULAR | Status: AC
  Administered 2016-03-31: 10 mg via ORAL
  Filled 2016-03-31: qty 1

## 2016-03-31 MED ORDER — DEXAMETHASONE SODIUM PHOSPHATE 10 MG/ML IJ SOLN
INTRAMUSCULAR | Status: AC
  Administered 2016-03-31: 10 mg via ORAL
  Filled 2016-03-31: qty 1

## 2016-03-31 NOTE — ED Notes (Signed)

## 2016-03-31 NOTE — ED Provider Notes (Signed)
[facility] Emergency Department Provider Note   ____________________________________________   First MD Initiated Contact with Patient 03/31/16 0119     (approximate)  I have reviewed the triage vital signs and the nursing notes.   HISTORY  Chief Complaint Chest Pain    HPI Corey Barnes is a 46 y.o. male who comes into the hospital today with some chest tightness and difficulty breathing. The patient reports that the symptoms started around 9 or 10 PM. He reports that he vomited a couple time and then became sweaty. By the time he arrived to the hospital his hands feet and head were tingling. He also had some tingling in his jaw. He reports that he still nauseous and his chest feels tight. His breathing is better but as he still felt tingly. He states that he had a similar episode multiple years ago where he had to go to [facility] but he doesn't remember exactly what was going on with a diagnosed him with. The patient rates his discomfort currently a 1 out of 10 in intensity. He is here today for evaluation. He reports that he has had a nonproductive cough recently.   History reviewed. No pertinent past medical history.  Patient Active Problem List   Diagnosis Date Noted  . BRBPR (bright red blood per rectum) 08/13/2015  . Right shoulder pain 08/12/2015    Past Surgical History:  Procedure Laterality Date  . CHEST SURGERY     Reports removal of a "growth" or repair of a hernia in the chest as a child    Prior to Admission medications   Medication Sig Start Date End Date Taking? Authorizing Provider  albuterol (PROVENTIL HFA;VENTOLIN HFA) 108 (90 Base) MCG/ACT inhaler Inhale 2 puffs into the lungs every 6 (six) hours as needed for wheezing or shortness of breath. 03/31/16   Rebecka Apley, MD  meloxicam (MOBIC) 15 MG tablet Take 1 tablet (15 mg total) by mouth daily as needed for pain. 09/07/15   Tommie Sams, DO    Allergies Prednisone and  Erythromycin  Family History  Problem Relation Age of Onset  . Heart failure Mother   . Cancer Father   . Diabetes Paternal Grandmother     Social History Social History  Substance Use Topics  . Smoking status: Current Every Day Smoker    Packs/day: 0.50    Types: Cigarettes  . Smokeless tobacco: Never Used  . Alcohol use 0.0 oz/week     Comment: 1x/month    Review of Systems Constitutional: No fever/chills Eyes: No visual changes. ENT: No sore throat. Cardiovascular: chest tightness Respiratory:  shortness of breath. Gastrointestinal: Nausea and vomiting No abdominal pain. No diarrhea.  No constipation. Genitourinary: Negative for dysuria. Musculoskeletal: Negative for back pain. Skin: Negative for rash. Neurological: Tingling in hands, feet and face  10-point ROS otherwise negative.  ____________________________________________   PHYSICAL EXAM:  VITAL SIGNS: ED Triage Vitals  Enc Vitals Group     BP 03/30/16 2229 (!) 148/94     Pulse Rate 03/30/16 2229 96     Resp 03/30/16 2229 20     Temp 03/30/16 2229 97.4 F (36.3 C)     Temp src --      SpO2 03/30/16 2229 100 %     Weight 03/30/16 2227 180 lb (81.6 kg)     Height 03/30/16 2227 5\' 10"  (1.778 m)     Head Circumference --      Peak Flow --  Pain Score 03/30/16 2227 3     Pain Loc --      Pain Edu? --      Excl. in [place]? --     Constitutional: Alert and oriented. Well appearing and in mild distress. Eyes: Conjunctivae are normal. PERRL. EOMI. Head: Atraumatic. Nose: No congestion/rhinnorhea. Mouth/Throat: Mucous membranes are moist.  Oropharynx non-erythematous. Cardiovascular: Normal rate, regular rhythm. Grossly normal heart sounds.  Good peripheral circulation. Respiratory: Normal respiratory effort.  No retractions. Lungs CTAB. Mildly diminished throughout Gastrointestinal: Soft and nontender. No distention. Positive bowel sounds Musculoskeletal: No lower extremity tenderness nor edema.    Neurologic:  Normal speech and language.  Skin:  Skin is warm, dry and intact. Psychiatric: Mood and affect are normal.   ____________________________________________   LABS (all labs ordered are listed, but only abnormal results are displayed)  Labs Reviewed  BASIC METABOLIC PANEL - Abnormal; Notable for the following:       Result Value   Glucose, Bld 104 (*)    All other components within normal limits  HEPATIC FUNCTION PANEL - Abnormal; Notable for the following:    Bilirubin, Direct <0.1 (*)    All other components within normal limits  CBC  TROPONIN I  LIPASE, BLOOD  TROPONIN I   ____________________________________________  EKG  ED ECG REPORT I, Rebecka Apley, the attending physician, personally viewed and interpreted this ECG.   Date: 03/30/2016  EKG Time: 2230  Rate: 89  Rhythm: normal sinus rhythm  Axis: normal  Intervals:none  ST&T Change: none  ____________________________________________  RADIOLOGY  CXR ____________________________________________   PROCEDURES  Procedure(s) performed: None  Procedures  Critical Care performed: No  ____________________________________________   INITIAL IMPRESSION / ASSESSMENT AND PLAN / ED COURSE  Pertinent labs & imaging results that were available during my care of the patient were reviewed by me and considered in my medical decision making (see chart for details).  This is a 46 year old male who comes into the hospital today with some chest tightness and shortness of breath. The patient reports that he developed some tingling to his hands and feet and head which is concerning for possible hyperventilation reaction. I will give the patient a DuoNeb treatment as he still has some chest tightness and he is a little diminished throughout. I will also give the patient some dexamethasone. I will check a lipase since he vomited as well as some liver function panel and repeat troponin. I will reassess the  patient once I received his results.  Clinical Course as of Mar 31 257  Thu Mar 31, 2016  0118 Mild bronchitic changes without focal consolidation DG Chest 2 View [AW]    Clinical Course User Index [AW] Rebecka Apley, MD    Patient's symptoms have some improvement with the DuoNeb treatment. The patient's repeat troponin is negative and the remainder of his blood work is unremarkable. The patient will be discharged home to follow-up with the acute care clinic. ____________________________________________   FINAL CLINICAL IMPRESSION(S) / ED DIAGNOSES  Final diagnoses:  Chest pain, unspecified type  Bronchitis      NEW MEDICATIONS STARTED DURING THIS VISIT:  New Prescriptions   ALBUTEROL (PROVENTIL HFA;VENTOLIN HFA) 108 (90 BASE) MCG/ACT INHALER    Inhale 2 puffs into the lungs every 6 (six) hours as needed for wheezing or shortness of breath.     Note:  This document was prepared using Dragon voice recognition software and may include unintentional dictation errors.    Rebecka Apley,  MD 03/31/16 [id number]

## 2016-04-01 DIAGNOSIS — F17200 Nicotine dependence, unspecified, uncomplicated: Secondary | ICD-10-CM | POA: Insufficient documentation

## 2016-04-01 DIAGNOSIS — F411 Generalized anxiety disorder: Secondary | ICD-10-CM | POA: Insufficient documentation

## 2020-01-01 ENCOUNTER — Ambulatory Visit
Admission: RE | Admit: 2020-01-01 | Discharge: 2020-01-01 | Disposition: A | Discharge status: Home / Self Care | Payer: Self-pay | Source: Ambulatory Visit | Attending: Chiropractor | Admitting: Chiropractor

## 2020-01-01 ENCOUNTER — Other Ambulatory Visit: Payer: Self-pay | Admitting: Chiropractor

## 2020-01-01 DIAGNOSIS — S2239XB Fracture of one rib, unspecified side, initial encounter for open fracture: Secondary | ICD-10-CM

## 2021-07-10 ENCOUNTER — Other Ambulatory Visit: Payer: Self-pay

## 2021-07-10 ENCOUNTER — Encounter: Payer: Self-pay | Admitting: Emergency Medicine

## 2021-07-10 ENCOUNTER — Emergency Department: Payer: Worker's Compensation

## 2021-07-10 ENCOUNTER — Emergency Department
Admission: EM | Admit: 2021-07-10 | Discharge: 2021-07-10 | Disposition: A | Discharge status: Home / Self Care | Payer: Worker's Compensation | Attending: Emergency Medicine | Admitting: Emergency Medicine

## 2021-07-10 DIAGNOSIS — S8392XA Sprain of unspecified site of left knee, initial encounter: Secondary | ICD-10-CM | POA: Diagnosis not present

## 2021-07-10 DIAGNOSIS — W010XXA Fall on same level from slipping, tripping and stumbling without subsequent striking against object, initial encounter: Secondary | ICD-10-CM | POA: Diagnosis not present

## 2021-07-10 DIAGNOSIS — M25562 Pain in left knee: Secondary | ICD-10-CM | POA: Diagnosis not present

## 2021-07-10 DIAGNOSIS — Y990 Civilian activity done for income or pay: Secondary | ICD-10-CM | POA: Insufficient documentation

## 2021-07-10 DIAGNOSIS — S8992XA Unspecified injury of left lower leg, initial encounter: Secondary | ICD-10-CM | POA: Diagnosis present

## 2021-07-10 MED ORDER — NAPROXEN 500 MG PO TABS: 500.0000 mg | ORAL_TABLET | Freq: Two times a day (BID) | ORAL | 0 refills | Status: AC

## 2021-07-10 MED ORDER — OXYCODONE-ACETAMINOPHEN 5-325 MG PO TABS
1.0000 | ORAL_TABLET | Freq: Once | ORAL | Status: AC
  Administered 2021-07-10: 1 via ORAL
  Filled 2021-07-10: qty 1

## 2021-07-10 NOTE — ED Notes (Signed)
Dc ppw provided, follow up and rx infomration given. Pt provides verbal consent for dc and is assisted to lobby in wheelchair

## 2021-07-10 NOTE — ED Provider Notes (Addendum)
[facility] Provider Note    Event Date/Time   First MD Initiated Contact with Patient 07/10/21 1156     (approximate)   History   Chief Complaint: Knee Injury   HPI  Corey Barnes is a 51 y.o. male with no significant past medical history who reports a trip and fall at a worksite yesterday.  Feels like he stepped into a shallow hole which caused him to lose his balance and fall.  He had pain immediately at the left knee.  Nonradiating.  More painful to walk on and bear weight.  No hip pain.  No head injury or loss of consciousness or other complaints.  No chest pain or shortness of breath.     Physical Exam   Triage Vital Signs: ED Triage Vitals  Enc Vitals Group     BP 07/10/21 1150 (!) 140/100     Pulse Rate 07/10/21 1150 (!) 103     Resp --      Temp 07/10/21 1150 98 F (36.7 C)     Temp Source 07/10/21 1150 Oral     SpO2 07/10/21 1150 96 %     Weight 07/10/21 1129 180 lb 12.4 oz (82 kg)     Height 07/10/21 1129 5\' 10"  (1.778 m)     Head Circumference --      Peak Flow --      Pain Score 07/10/21 1129 9     Pain Loc --      Pain Edu? --      Excl. in [place]? --     Most recent vital signs: Vitals:   07/10/21 1150  BP: (!) 140/100  Pulse: (!) 103  Temp: 98 F (36.7 C)  SpO2: 96%    General: Awake, no distress.  CV:  Good peripheral perfusion.  Normal DP pulse and cap refill in the left foot Resp:  Normal effort.  Abd:  No distention.  Other:  Tenderness at the left distal thigh just above the knee.  No palpable deformity.  No laceration or ecchymosis.  No knee effusion.  Mild tenderness at the proximal tibia   ED Results / Procedures / Treatments   Labs (all labs ordered are listed, but only abnormal results are displayed) Labs Reviewed - No data to display   EKG    RADIOLOGY X-ray left knee viewed and interpreted by me, appears normal except for visible knee effusion.  Radiology report reviewed.  X-ray left femur  reviewed and interpreted by me, appears normal except for knee effusion.  Radiology report reviewed   PROCEDURES:  Procedures   MEDICATIONS ORDERED IN ED: Medications  oxyCODONE-acetaminophen (PERCOCET/ROXICET) 5-325 MG per tablet 1 tablet (1 tablet Oral Given 07/10/21 1229)     IMPRESSION / MDM / ASSESSMENT AND PLAN / ED COURSE  I reviewed the triage vital signs and the nursing notes.                              Differential diagnosis includes, but is not limited to, femur fracture, tibia fracture, knee sprain, meniscus injury  Patient's presentation is most consistent with acute complicated illness / injury requiring diagnostic workup.  Patient presents with left knee pain after a trip and fall.  Exam worrisome for distal femur fracture primarily, x-rays obtained which are reassuring.  Will provide crutches, Ace wrap, pain control, recommend follow-up with orthopedics.       FINAL CLINICAL IMPRESSION(S) /  ED DIAGNOSES   Final diagnoses:  Acute pain of left knee  Sprain of left knee, unspecified ligament, initial encounter     Rx / DC Orders   ED Discharge Orders          Ordered    naproxen (NAPROSYN) 500 MG tablet  2 times daily with meals        07/10/21 1241             Note:  This document was prepared using Dragon voice recognition software and may include unintentional dictation errors.   Carrie Mew, MD 07/10/21 [place]    Carrie Mew, MD 07/10/21 1241

## 2021-07-10 NOTE — ED Triage Notes (Signed)
Pt reports hurt left knee at work yesterday. States painful to walk on.

## 2021-09-10 DIAGNOSIS — M25561 Pain in right knee: Secondary | ICD-10-CM | POA: Insufficient documentation

## 2022-04-04 ENCOUNTER — Ambulatory Visit: Payer: Self-pay | Admitting: Family

## 2022-04-04 ENCOUNTER — Encounter: Payer: Self-pay | Admitting: Family

## 2022-04-04 VITALS — BP 110/72 | HR 80 | Ht 70.0 in | Wt 220.4 lb

## 2022-04-04 DIAGNOSIS — M542 Cervicalgia: Secondary | ICD-10-CM

## 2022-04-04 DIAGNOSIS — M2391 Unspecified internal derangement of right knee: Secondary | ICD-10-CM | POA: Insufficient documentation

## 2022-04-04 DIAGNOSIS — M5126 Other intervertebral disc displacement, lumbar region: Secondary | ICD-10-CM | POA: Insufficient documentation

## 2022-04-04 DIAGNOSIS — M7918 Myalgia, other site: Secondary | ICD-10-CM | POA: Insufficient documentation

## 2022-04-04 DIAGNOSIS — R7303 Prediabetes: Secondary | ICD-10-CM

## 2022-04-04 DIAGNOSIS — R197 Diarrhea, unspecified: Secondary | ICD-10-CM

## 2022-04-04 DIAGNOSIS — M5020 Other cervical disc displacement, unspecified cervical region: Secondary | ICD-10-CM | POA: Insufficient documentation

## 2022-04-04 DIAGNOSIS — I10 Essential (primary) hypertension: Secondary | ICD-10-CM

## 2022-04-04 DIAGNOSIS — G8929 Other chronic pain: Secondary | ICD-10-CM

## 2022-04-04 DIAGNOSIS — E782 Mixed hyperlipidemia: Secondary | ICD-10-CM

## 2022-04-04 DIAGNOSIS — M5442 Lumbago with sciatica, left side: Secondary | ICD-10-CM | POA: Insufficient documentation

## 2022-04-04 DIAGNOSIS — M533 Sacrococcygeal disorders, not elsewhere classified: Secondary | ICD-10-CM

## 2022-04-04 DIAGNOSIS — G5621 Lesion of ulnar nerve, right upper limb: Secondary | ICD-10-CM | POA: Insufficient documentation

## 2022-04-04 DIAGNOSIS — M5441 Lumbago with sciatica, right side: Secondary | ICD-10-CM | POA: Insufficient documentation

## 2022-04-04 HISTORY — DX: Cervicalgia: M54.2

## 2022-04-04 HISTORY — DX: Sacrococcygeal disorders, not elsewhere classified: M53.3

## 2022-04-04 HISTORY — DX: Mixed hyperlipidemia: E78.2

## 2022-04-04 MED ORDER — LOSARTAN POTASSIUM 50 MG PO TABS: 50.0000 mg | ORAL_TABLET | Freq: Every day | ORAL | 1 refills | Status: DC

## 2022-04-04 MED ORDER — SULFAMETHOXAZOLE-TRIMETHOPRIM 800-160 MG PO TABS: 1.0000 | ORAL_TABLET | Freq: Two times a day (BID) | ORAL | 0 refills | Status: AC

## 2022-04-04 NOTE — Progress Notes (Signed)
Established Patient Office Visit  Subjective:  Patient ID: Corey Barnes, male    DOB: 29-Jan-1971  Age: 52 y.o. MRN: [id number]  Chief Complaint  Patient presents with   Follow-up    Follow up    Stomach issues.   Started at the end of January.  Went out to eat with daughter, vomited in the parking lot. For a few days, he was having N/V/D, but is now mostly having diarrhea.   Almost every time he eats, he's been having diarrhea since.  He needs refills for meds but is otherwise doing well.   Diarrhea  This is a new problem. The current episode started more than 1 month ago. The problem occurs 5 to 10 times per day. The problem has been unchanged. The stool consistency is described as Watery. Nothing aggravates the symptoms. Risk factors include suspect food intake. He has tried electrolyte solution and change of diet for the symptoms. The treatment provided mild relief.     Past Medical History:  Diagnosis Date   Neck pain 04/04/2022   Pain of both sacroiliac joints 04/04/2022   Past Surgical History:  Procedure Laterality Date   CHEST SURGERY     Reports removal of a "growth" or repair of a hernia in the chest as a child   Social History   Socioeconomic History   Marital status: Single    Spouse name: Not on file   Number of children: Not on file   Years of education: Not on file   Highest education level: Not on file  Occupational History   Not on file  Tobacco Use   Smoking status: Every Day    Packs/day: 0.50    Years: 32.00    Total pack years: 16.00    Types: Cigarettes   Smokeless tobacco: Never  Substance and Sexual Activity   Alcohol use: Yes    Alcohol/week: 0.0 standard drinks of alcohol    Comment: 1x/month   Drug use: No   Sexual activity: Yes    Partners: Female    Comment: Girlfriend  Other Topics Concern   Not on file  Social History Narrative   Not on file   Social Determinants of Health   Financial Resource Strain: Not on file   Food Insecurity: Not on file  Transportation Needs: Not on file  Physical Activity: Not on file  Stress: Not on file  Social Connections: Not on file  Intimate Partner Violence: Not on file    Family History  Problem Relation Age of Onset   Heart failure Mother    Cancer Father    Diabetes Paternal Grandmother     Allergies  Allergen Reactions   Prednisone Shortness Of Breath and Other (See Comments)    Caused tachycardia, could not sleep, and was jittery while on the medication   Erythromycin Nausea And Vomiting, Nausea Only and Other (See Comments)   Tramadol Other (See Comments)    Review of Systems  Gastrointestinal:  Positive for diarrhea.  All other systems reviewed and are negative.      Objective:   BP 110/72   Pulse 80   Ht '5\' 10"'$  (1.778 m)   Wt 220 lb 6.4 oz (100 kg)   SpO2 97%   BMI 31.62 kg/m   Vitals:   04/04/22 1034  BP: 110/72  Pulse: 80  Height: '5\' 10"'$  (1.778 m)  Weight: 220 lb 6.4 oz (100 kg)  SpO2: 97%  BMI (Calculated): 31.62  Physical Exam Vitals and nursing note reviewed.  Constitutional:      Appearance: Normal appearance. He is normal weight.  Eyes:     Pupils: Pupils are equal, round, and reactive to light.  Cardiovascular:     Rate and Rhythm: Normal rate and regular rhythm.     Pulses: Normal pulses.     Heart sounds: Normal heart sounds.  Pulmonary:     Effort: Pulmonary effort is normal.     Breath sounds: Normal breath sounds.  Neurological:     Mental Status: He is alert.  Psychiatric:        Mood and Affect: Mood normal.        Behavior: Behavior normal.    Outpatient Encounter Medications as of 04/04/2022  Medication Sig   albuterol (PROVENTIL HFA;VENTOLIN HFA) 108 (90 Base) MCG/ACT inhaler Inhale 2 puffs into the lungs every 6 (six) hours as needed for wheezing or shortness of breath.   sulfamethoxazole-trimethoprim (BACTRIM DS) 800-160 MG tablet Take 1 tablet by mouth 2 (two) times daily for 5 days.    [DISCONTINUED] losartan (COZAAR) 50 MG tablet Take 50 mg by mouth daily.   losartan (COZAAR) 50 MG tablet Take 1 tablet (50 mg total) by mouth daily.   meloxicam (MOBIC) 15 MG tablet Take 1 tablet (15 mg total) by mouth daily as needed for pain.   naproxen (NAPROSYN) 500 MG tablet Take 1 tablet (500 mg total) by mouth 2 (two) times daily with a meal.   No facility-administered encounter medications on file as of 04/04/2022.     No results found for any visits on 04/04/22.  No results found for this or any previous visit (from the past 2160 hour(s)).    Assessment & Plan:   Problem List Items Addressed This Visit     Other chronic pain   Mixed hyperlipidemia   Relevant Medications   losartan (COZAAR) 50 MG tablet   Other Relevant Orders   CBC With Differential   Lipid panel   Other Visit Diagnoses     Diarrhea of presumed infectious origin    -  Primary   Prediabetes       Relevant Orders   Hemoglobin A1c   Essential hypertension, benign       Relevant Medications   losartan (COZAAR) 50 MG tablet       Return in about 3 months (around 07/05/2022).   Total time spent: 20 minutes  Mechele Claude, FNP  04/04/2022

## 2022-04-05 LAB — CBC WITH DIFFERENTIAL
Basophils Absolute: 0.1 10*3/uL (ref 0.0–0.2)
Basos: 1 %
EOS (ABSOLUTE): 0.1 10*3/uL (ref 0.0–0.4)
Eos: 1 %
Hematocrit: 48 % (ref 37.5–51.0)
Hemoglobin: 16.3 g/dL (ref 13.0–17.7)
Immature Grans (Abs): 0 10*3/uL (ref 0.0–0.1)
Immature Granulocytes: 0 %
Lymphocytes Absolute: 1.6 10*3/uL (ref 0.7–3.1)
Lymphs: 28 %
MCH: 30.4 pg (ref 26.6–33.0)
MCHC: 34 g/dL (ref 31.5–35.7)
MCV: 89 fL (ref 79–97)
Monocytes Absolute: 0.5 10*3/uL (ref 0.1–0.9)
Monocytes: 9 %
Neutrophils Absolute: 3.5 10*3/uL (ref 1.4–7.0)
Neutrophils: 61 %
RBC: 5.37 x10E6/uL (ref 4.14–5.80)
RDW: 13.6 % (ref 11.6–15.4)
WBC: 5.7 10*3/uL (ref 3.4–10.8)

## 2022-04-05 LAB — LIPID PANEL
Chol/HDL Ratio: 9.7 ratio — ABNORMAL HIGH (ref 0.0–5.0)
Cholesterol, Total: 330 mg/dL — ABNORMAL HIGH (ref 100–199)
HDL: 34 mg/dL — ABNORMAL LOW (ref >39)
LDL Chol Calc (NIH): 253 mg/dL — ABNORMAL HIGH (ref 0–99)
Triglycerides: 210 mg/dL — ABNORMAL HIGH (ref 0–149)
VLDL Cholesterol Cal: 43 mg/dL — ABNORMAL HIGH (ref 5–40)

## 2022-04-05 LAB — HEMOGLOBIN A1C
Est. average glucose Bld gHb Est-mCnc: 117 mg/dL
Hgb A1c MFr Bld: 5.7 % — ABNORMAL HIGH (ref 4.8–5.6)

## 2022-04-11 ENCOUNTER — Encounter: Payer: Self-pay | Admitting: Family

## 2022-04-11 MED ORDER — ATORVASTATIN CALCIUM 20 MG PO TABS: 20.0000 mg | ORAL_TABLET | Freq: Every day | ORAL | 1 refills | Status: AC

## 2022-04-14 ENCOUNTER — Telehealth: Payer: Self-pay

## 2022-04-14 NOTE — Telephone Encounter (Signed)
Pt called and left vm regarding rx for choelsterol, said having some side effects. Muscle cramping, diarrhea with some blood, no energy, one area of left leg calf area where he is cramping now has some redness to the area. I advised pt to stop taking rx, just wanted to see where you wanted to go from here regarding this, please advise

## 2022-07-05 ENCOUNTER — Ambulatory Visit: Payer: Self-pay | Admitting: Family

## 2022-08-19 ENCOUNTER — Other Ambulatory Visit: Payer: Self-pay

## 2022-08-19 ENCOUNTER — Emergency Department: Payer: Self-pay

## 2022-08-19 ENCOUNTER — Emergency Department
Admission: EM | Admit: 2022-08-19 | Discharge: 2022-08-19 | Disposition: A | Discharge status: Home / Self Care | Payer: Self-pay | Attending: Emergency Medicine | Admitting: Emergency Medicine

## 2022-08-19 DIAGNOSIS — R531 Weakness: Secondary | ICD-10-CM | POA: Insufficient documentation

## 2022-08-19 DIAGNOSIS — I10 Essential (primary) hypertension: Secondary | ICD-10-CM | POA: Insufficient documentation

## 2022-08-19 DIAGNOSIS — R000 Tachycardia, unspecified: Secondary | ICD-10-CM | POA: Insufficient documentation

## 2022-08-19 DIAGNOSIS — R0602 Shortness of breath: Secondary | ICD-10-CM | POA: Insufficient documentation

## 2022-08-19 DIAGNOSIS — F419 Anxiety disorder, unspecified: Secondary | ICD-10-CM | POA: Insufficient documentation

## 2022-08-19 DIAGNOSIS — E860 Dehydration: Secondary | ICD-10-CM

## 2022-08-19 DIAGNOSIS — R42 Dizziness and giddiness: Secondary | ICD-10-CM | POA: Insufficient documentation

## 2022-08-19 LAB — COMPREHENSIVE METABOLIC PANEL
ALT: 27 U/L (ref 0–44)
AST: 37 U/L (ref 15–41)
Albumin: 4.6 g/dL (ref 3.5–5.0)
Alkaline Phosphatase: 148 U/L — ABNORMAL HIGH (ref 38–126)
Anion gap: 16 — ABNORMAL HIGH (ref 5–15)
BUN: 6 mg/dL (ref 6–20)
CO2: 22 mmol/L (ref 22–32)
Calcium: 10 mg/dL (ref 8.9–10.3)
Chloride: 99 mmol/L (ref 98–111)
Creatinine, Ser: 0.89 mg/dL (ref 0.61–1.24)
GFR, Estimated: >60 mL/min (ref >60)
Glucose, Bld: 130 mg/dL — ABNORMAL HIGH (ref 70–99)
Potassium: 3.5 mmol/L (ref 3.5–5.1)
Sodium: 137 mmol/L (ref 135–145)
Total Bilirubin: 1.1 mg/dL (ref 0.3–1.2)
Total Protein: 9.3 g/dL — ABNORMAL HIGH (ref 6.5–8.1)

## 2022-08-19 LAB — CBC
HCT: 54.3 % — ABNORMAL HIGH (ref 39.0–52.0)
Hemoglobin: 18.5 g/dL — ABNORMAL HIGH (ref 13.0–17.0)
MCH: 30.3 pg (ref 26.0–34.0)
MCHC: 34.1 g/dL (ref 30.0–36.0)
MCV: 88.9 fL (ref 80.0–100.0)
Platelets: 383 10*3/uL (ref 150–400)
RBC: 6.11 MIL/uL — ABNORMAL HIGH (ref 4.22–5.81)
RDW: 12.9 % (ref 11.5–15.5)
WBC: 9.9 10*3/uL (ref 4.0–10.5)
nRBC: 0 % (ref 0.0–0.2)

## 2022-08-19 LAB — TROPONIN I (HIGH SENSITIVITY)
Troponin I (High Sensitivity): 3 ng/L (ref <18)
Troponin I (High Sensitivity): 5 ng/L (ref <18)

## 2022-08-19 LAB — BRAIN NATRIURETIC PEPTIDE: B Natriuretic Peptide: 11.7 pg/mL (ref 0.0–100.0)

## 2022-08-19 LAB — CK: Total CK: 106 U/L (ref 49–397)

## 2022-08-19 MED ORDER — HYDROXYZINE HCL 25 MG PO TABS: 25.0000 mg | ORAL_TABLET | Freq: Three times a day (TID) | ORAL | 0 refills | Status: AC | PRN

## 2022-08-19 MED ORDER — LORAZEPAM 1 MG PO TABS
1.0000 mg | ORAL_TABLET | Freq: Once | ORAL | Status: AC
  Administered 2022-08-19: 1 mg via ORAL
  Filled 2022-08-19: qty 1

## 2022-08-19 MED ORDER — SODIUM CHLORIDE 0.9 % IV BOLUS
1000.0000 mL | Freq: Once | INTRAVENOUS | Status: AC
  Administered 2022-08-19: 1000 mL via INTRAVENOUS

## 2022-08-19 NOTE — ED Notes (Signed)
Pt. Up to bathroom with stand by assist.

## 2022-08-19 NOTE — ED Notes (Signed)
Pt. Up to bathroom indep. Gait steady, NAD. 

## 2022-08-19 NOTE — ED Provider Notes (Signed)
[facility] Provider Note    Event Date/Time   First MD Initiated Contact with Patient 08/19/22 1649     (approximate)  History   Chief Complaint: Shortness of Breath  HPI  Corey Barnes is a 52 y.o. male with a past medical history of hypertension who presents to the emergency department.  According to the patient for the last couple days he has not been feeling very well.  States he has been dizzy at times, states he was just not feeling right per patient.  Checked his blood pressure this morning he was persistently low in the 90s on the top number.  Patient became concerned so he asked his family member to bring him to the emergency department.  He states shortly after arrival to the emergency department he began feeling a lot of tingling in his hands and feet, was having shaking and felt like all of his extremities stiffened up hands got tight and he was unable to relax them arms were stiff and legs were stiff.  In triage patient was noted to have significant hypertension with a blood pressure 247/186.  However triage nurse states that the patient was not able to stand still for the blood pressure lot of shaking and anxiety.  Patient states since coming back to the room in the emergency department he is much more calm and states most of his symptoms have resolved.  Patient denies any chest pain at any point.  States he was having some mild shortness of breath earlier but that has since resolved.  Patient states he has had anxiety and 1 prior panic attack but he is not sure what is going on today.  States he has not been eating or drinking very much recently but he is not sure why.  Denies any abdominal pain denies any vomiting states he has had a couple episodes of diarrhea over the past week or so.  Blood pressure during my evaluation in the room is 133/93.  Physical Exam   Triage Vital Signs: ED Triage Vitals  Encounter Vitals Group     BP 08/19/22 1404 (!)  247/186     Systolic BP Percentile --      Diastolic BP Percentile --      Pulse Rate 08/19/22 1353 (!) 152     Resp 08/19/22 1353 (!) 22     Temp 08/19/22 1353 98.7 F (37.1 C)     Temp Source 08/19/22 1353 Axillary     SpO2 08/19/22 1404 100 %     Weight 08/19/22 1359 220 lb (99.8 kg)     Height 08/19/22 1359 5\' 10"  (1.778 m)     Head Circumference --      Peak Flow --      Pain Score 08/19/22 1358 4     Pain Loc --      Pain Education --      Exclude from Growth Chart --     Most recent vital signs: Vitals:   08/19/22 1404 08/19/22 1517  BP: (!) 247/186 (!) 163/119  Pulse: (!) 118   Resp: 18   Temp:    SpO2: 100%     General: Awake, no distress.  CV:  Good peripheral perfusion.  Regular rate and rhythm  Resp:  Normal effort.  Equal breath sounds bilaterally.  Abd:  No distention.  Soft, nontender.  No rebound or guarding.   ED Results / Procedures / Treatments   EKG  EKG viewed and  interpreted by myself shows sinus tachycardia 119 bpm the narrow QRS, normal axis, normal intervals nonspecific ST changes.  Significant logical interference.  RADIOLOGY  Chest x-ray viewed and interpreted by myself shows no consolidation. radiology has read the chest x-ray is negative   MEDICATIONS ORDERED IN ED: Medications  LORazepam (ATIVAN) tablet 1 mg (has no administration in time range)  sodium chloride 0.9 % bolus 1,000 mL (has no administration in time range)     IMPRESSION / MDM / ASSESSMENT AND PLAN / ED COURSE  I reviewed the triage vital signs and the nursing notes.  Patient's presentation is most consistent with acute presentation with potential threat to life or bodily function.  Patient presents emergency department for not feeling right per patient and lower blood pressure this morning.  Overall patient appears well during my evaluation.  His symptoms are very suggestive of possible anxiety attack or panic disorder especially given the complaint of tingling in  his hands and feet and locking up of his extremities especially of his hands and feet.  This could be indicative of hypocarbia possibly from hyperventilation.  This seems to fit the patient's initial triage as well where he was noted to be significantly hypertensive and anxious.  Patient's workup in the emergency department is overall reassuring.  Chemistry shows a slight anion gap elevation but no other concerning findings.  CBC does show mild hemoconcentration both of which could be indicative of dehydration we will IV hydrate.  Troponin reassuringly negative CK is normal.  Chest x-ray reassuring EKG shows tachycardia electrical interference likely from the patient's shaking/tremor and anxiety when initially presented although currently calm.  Blood pressures currently 133/93 during my evaluation.  We will dose 1 mg of oral Ativan we will IV hydrate we will repeat a troponin as precaution and continue to closely monitor.  Patient agreeable to plan of care.  Patient is feeling much better.  Repeat troponin is negative.  Given the patient's reassuring workup we will discharge home.  I will prescribe a short course of hydroxyzine to be used if needed.  I discussed with the patient to increase significantly the amount of fluid he drinks over the next 2 days and to stay out of the heat and avoid exertion.  Patient agreeable to plan of care discussed return precautions.  FINAL CLINICAL IMPRESSION(S) / ED DIAGNOSES   Weakness Anxiety    Note:  This document was prepared using Dragon voice recognition software and may include unintentional dictation errors.   Minna Antis, MD 08/19/22 [phone]

## 2022-08-19 NOTE — ED Notes (Signed)
Charge nurse Marylene Land called for a room.

## 2022-08-19 NOTE — Discharge Instructions (Addendum)
As we discussed please drink plenty fluids over the next 2 days.  Please take your hydroxyzine if needed for any symptoms.  Do not drink alcohol or drive while taking this medication.  Please follow-up with your doctor in the next 2 to 3 days for recheck/reevaluation.

## 2022-08-19 NOTE — ED Notes (Signed)
Unable to obtain EKG due to rigors of patient.

## 2022-08-19 NOTE — ED Notes (Signed)
Patient is more calm, skin is cool and clammy. Upper arms are still partially contracted. Patient has good speech, clear, able to hold a complex conversation. No memory impairment.

## 2022-08-19 NOTE — ED Notes (Addendum)
Patient was brought up to First Nurse via wheelchair by family who states that the patient thought he was going to have "another episode." Patient's arm were contracted and shaking. Patient remains alert and oriented, normal speech. No respiratory problems noted. Patient was brought into the triage room with his son. Will attempt to obtain another EKG.

## 2022-08-19 NOTE — ED Triage Notes (Signed)
Per patient's report, patient has been short of breath and "not feeling good." Patient's arms and hand are contracted, tremors through out the body, legs are rigid. Patient is alert and oriented, states these symptoms just began upon arrival. Patient is diaphoretic.

## 2022-08-22 ENCOUNTER — Ambulatory Visit: Payer: Self-pay | Admitting: Family

## 2022-08-22 VITALS — BP 120/102 | HR 88 | Ht 70.0 in | Wt 194.0 lb

## 2022-08-22 DIAGNOSIS — E538 Deficiency of other specified B group vitamins: Secondary | ICD-10-CM

## 2022-08-22 DIAGNOSIS — R079 Chest pain, unspecified: Secondary | ICD-10-CM

## 2022-08-22 NOTE — Progress Notes (Signed)
Established Patient Office Visit  Subjective:  Patient ID: Corey Barnes, male    DOB: 27-Oct-1970  Age: 52 y.o. MRN: [id number]  Chief Complaint  Patient presents with   Hospitalization Follow-up    Micah Flesher to hospital Friday    Patient here after recently going to the ED.  He says that for the last few weeks he's been noticing some increase in shortness of breath and lightheadedness.  He also had an episode of chest pain, which was what took him to the hospital.   Vision seems to be a little bit different as well, does have newer lenses, but has been having "wonky" vision.  Tingling in hands, feet, and sometimes his head.  Weakness as well.   No other concerns at this time.   Past Medical History:  Diagnosis Date   Neck pain 04/04/2022   Pain of both sacroiliac joints 04/04/2022    Past Surgical History:  Procedure Laterality Date   CHEST SURGERY     Reports removal of a "growth" or repair of a hernia in the chest as a child    Social History   Socioeconomic History   Marital status: Single    Spouse name: Not on file   Number of children: Not on file   Years of education: Not on file   Highest education level: Not on file  Occupational History   Not on file  Tobacco Use   Smoking status: Every Day    Current packs/day: 0.50    Average packs/day: 0.5 packs/day for 32.0 years (16.0 ttl pk-yrs)    Types: Cigarettes   Smokeless tobacco: Never  Substance and Sexual Activity   Alcohol use: Yes    Alcohol/week: 0.0 standard drinks of alcohol    Comment: 1x/month   Drug use: No   Sexual activity: Yes    Partners: Female    Comment: Girlfriend  Other Topics Concern   Not on file  Social History Narrative   Not on file   Social Determinants of Health   Financial Resource Strain: Not on file  Food Insecurity: Not on file  Transportation Needs: Not on file  Physical Activity: Not on file  Stress: Not on file  Social Connections: Unknown (06/14/2021)    Received from [facility]   Social Network    Social Network: Not on file  Intimate Partner Violence: Unknown (05/06/2021)   Received from [facility], [facility]   HITS    Physically Hurt: Not on file    Insult or Talk Down To: Not on file    Threaten Physical Harm: Not on file    Scream or Curse: Not on file    Family History  Problem Relation Age of Onset   Heart failure Mother    Cancer Father    Diabetes Paternal Grandmother     Allergies  Allergen Reactions   Prednisone Shortness Of Breath and Other (See Comments)    Caused tachycardia, could not sleep, and was jittery while on the medication   Erythromycin Nausea And Vomiting, Nausea Only and Other (See Comments)   Tramadol Other (See Comments)    Review of Systems  Constitutional:  Positive for malaise/fatigue.  Eyes:  Positive for blurred vision.  Respiratory:  Positive for shortness of breath.   Cardiovascular:  Positive for chest pain and palpitations.  Neurological:  Positive for dizziness, weakness and headaches.  All other systems reviewed and are negative.      Objective:  BP (!) 120/102   Pulse 88   Ht 5\' 10"  (1.778 m)   Wt 194 lb (88 kg)   SpO2 98%   BMI 27.84 kg/m   Vitals:   08/22/22 1304  BP: (!) 120/102  Pulse: 88  Height: 5\' 10"  (1.778 m)  Weight: 194 lb (88 kg)  SpO2: 98%  BMI (Calculated): 27.84    Physical Exam Vitals and nursing note reviewed.  Constitutional:      Appearance: Normal appearance. He is normal weight.  HENT:     Head: Normocephalic and atraumatic.  Eyes:     Extraocular Movements: Extraocular movements intact.     Conjunctiva/sclera: Conjunctivae normal.     Pupils: Pupils are equal, round, and reactive to light.  Cardiovascular:     Rate and Rhythm: Normal rate and regular rhythm.     Pulses: Normal pulses.     Heart sounds: Normal heart sounds.  Pulmonary:     Effort: Pulmonary effort is normal.     Breath sounds: Normal breath  sounds.  Neurological:     General: No focal deficit present.     Mental Status: He is alert and oriented to person, place, and time. Mental status is at baseline.  Psychiatric:        Mood and Affect: Mood normal.        Behavior: Behavior normal.        Thought Content: Thought content normal.        Judgment: Judgment normal.      Results for orders placed or performed in visit on 08/22/22  Vitamin B12  Result Value Ref Range   Vitamin B-12 217 (L) 232 - 1,245 pg/mL    Recent Results (from the past 2160 hour(s))  CBC     Status: Abnormal   Collection Time: 08/19/22  2:13 PM  Result Value Ref Range   WBC 9.9 4.0 - 10.5 K/uL   RBC 6.11 (H) 4.22 - 5.81 MIL/uL   Hemoglobin 18.5 (H) 13.0 - 17.0 g/dL   HCT 16.1 (H) 09.6 - 04.5 %   MCV 88.9 80.0 - 100.0 fL   MCH 30.3 26.0 - 34.0 pg   MCHC 34.1 30.0 - 36.0 g/dL   RDW 40.9 81.1 - 91.4 %   Platelets 383 150 - 400 K/uL   nRBC 0.0 0.0 - 0.2 %    Comment: Performed at [facility], [address] [place], [place] [address]  Comprehensive metabolic panel     Status: Abnormal   Collection Time: 08/19/22  2:13 PM  Result Value Ref Range   Sodium 137 135 - 145 mmol/L   Potassium 3.5 3.5 - 5.1 mmol/L   Chloride 99 98 - 111 mmol/L   CO2 22 22 - 32 mmol/L   Glucose, Bld 130 (H) 70 - 99 mg/dL    Comment: Glucose reference range applies only to samples taken after fasting for at least 8 hours.   BUN 6 6 - 20 mg/dL   Creatinine, Ser 6.21 0.61 - 1.24 mg/dL   Calcium 30.8 8.9 - 65.7 mg/dL   Total Protein 9.3 (H) 6.5 - 8.1 g/dL   Albumin 4.6 3.5 - 5.0 g/dL   AST 37 15 - 41 U/L   ALT 27 0 - 44 U/L   Alkaline Phosphatase 148 (H) 38 - 126 U/L   Total Bilirubin 1.1 0.3 - 1.2 mg/dL   GFR, Estimated >84 >69 mL/min    Comment: (NOTE) Calculated using the CKD-EPI Creatinine Equation (  2021)    Anion gap 16 (H) 5 - 15    Comment: Performed at [facility], [address] [place], [place] [address]  Troponin I (High  Sensitivity)     Status: None   Collection Time: 08/19/22  2:13 PM  Result Value Ref Range   Troponin I (High Sensitivity) 3 <18 ng/L    Comment: (NOTE) Elevated high sensitivity troponin I (hsTnI) values and significant  changes across serial measurements may suggest ACS but many other  chronic and acute conditions are known to elevate hsTnI results.  Refer to the "Links" section for chest pain algorithms and additional  guidance. Performed at [facility], [address] [place], [place] [address]   CK     Status: None   Collection Time: 08/19/22  2:13 PM  Result Value Ref Range   Total CK 106 49 - 397 U/L    Comment: Performed at [facility], [address] [place], [place] [address]  Brain natriuretic peptide     Status: None   Collection Time: 08/19/22  2:24 PM  Result Value Ref Range   B Natriuretic Peptide 11.7 0.0 - 100.0 pg/mL    Comment: Performed at [facility], [address] [place], [place] [address]  Troponin I (High Sensitivity)     Status: None   Collection Time: 08/19/22  6:54 PM  Result Value Ref Range   Troponin I (High Sensitivity) 5 <18 ng/L    Comment: (NOTE) Elevated high sensitivity troponin I (hsTnI) values and significant  changes across serial measurements may suggest ACS but many other  chronic and acute conditions are known to elevate hsTnI results.  Refer to the "Links" section for chest pain algorithms and additional  guidance. Performed at [facility], [address] [place], [place] [address]   Vitamin B12     Status: Abnormal   Collection Time: 08/22/22  2:26 PM  Result Value Ref Range   Vitamin B-12 217 (L) 232 - 1,245 pg/mL       Assessment & Plan:   Problem List Items Addressed This Visit   None Visit Diagnoses     Chest pain, unspecified type    -  Primary   EKG in office today is concerning, and I would like to sent pt. to Cardiology, but does not have insurance. Asks that if this  happens again he go at that time.   Relevant Orders   EKG 12-Lead   B12 deficiency       Suspect B12 might be partly to blame.  Checking level today.   Will follow up with results when they are back.   Relevant Orders   Vitamin B12 (Completed)       Return if symptoms worsen or fail to improve.   Total time spent: 20 minutes  Miki Kins, FNP  08/22/2022   This document may have been prepared by [facility] Voice Recognition software and as such may include unintentional dictation errors.

## 2022-08-23 LAB — VITAMIN B12: Vitamin B-12: 217 pg/mL — ABNORMAL LOW (ref 232–1245)

## 2022-08-25 ENCOUNTER — Encounter: Payer: Self-pay | Admitting: Family

## 2022-08-25 ENCOUNTER — Telehealth: Payer: Self-pay | Admitting: Family

## 2022-08-25 NOTE — Telephone Encounter (Signed)
Spoke with Marchelle Folks and yes, he should go to ED. Patient notified.

## 2022-08-25 NOTE — Telephone Encounter (Signed)
Having cold sweats, chest pressure and pressure in middle of shoulder blades. BP 122/107 pulse 134. Toes numb and tingly fingertips.  Should he go to ED?

## 2022-08-26 ENCOUNTER — Ambulatory Visit: Payer: Self-pay | Admitting: Family

## 2022-08-26 ENCOUNTER — Telehealth: Payer: Self-pay | Admitting: Family

## 2022-08-26 DIAGNOSIS — E538 Deficiency of other specified B group vitamins: Secondary | ICD-10-CM

## 2022-08-26 MED ORDER — CYANOCOBALAMIN 1000 MCG/ML IJ SOLN
1000.0000 ug | Freq: Once | INTRAMUSCULAR | Status: AC
Start: 2022-08-26 — End: 2022-08-26
  Administered 2022-08-26: 1000 ug via INTRAMUSCULAR

## 2022-08-26 NOTE — Telephone Encounter (Signed)
Patient informed that it could be from his B12, came into office today for b12 shot

## 2022-08-26 NOTE — Telephone Encounter (Signed)
Per Marchelle Folks, patient's B12 is low and may be causing a lot of the symptoms that he called about yesterday. We need to call patient and see if he wants to come by and get a B12 shot as long as he wasn't admitted yesterday.

## 2022-08-31 ENCOUNTER — Encounter: Payer: Self-pay | Admitting: Family

## 2022-09-12 ENCOUNTER — Other Ambulatory Visit: Payer: Self-pay | Admitting: Family

## 2022-09-21 DIAGNOSIS — I2510 Atherosclerotic heart disease of native coronary artery without angina pectoris: Secondary | ICD-10-CM | POA: Insufficient documentation

## 2022-09-21 HISTORY — DX: Atherosclerotic heart disease of native coronary artery without angina pectoris: I25.10

## 2022-09-27 DIAGNOSIS — I10 Essential (primary) hypertension: Secondary | ICD-10-CM | POA: Insufficient documentation

## 2022-09-27 DIAGNOSIS — F419 Anxiety disorder, unspecified: Secondary | ICD-10-CM | POA: Insufficient documentation

## 2022-12-02 IMAGING — CR DG FEMUR 2+V*L*
4 series · 4 of 4 positions shown · non-contrast
Comparison: None Available.

CLINICAL DATA: Knee and distal thigh pain after trip and fall.

EXAM:
LEFT FEMUR 2 VIEWS

[femur ap (1 of 2)]
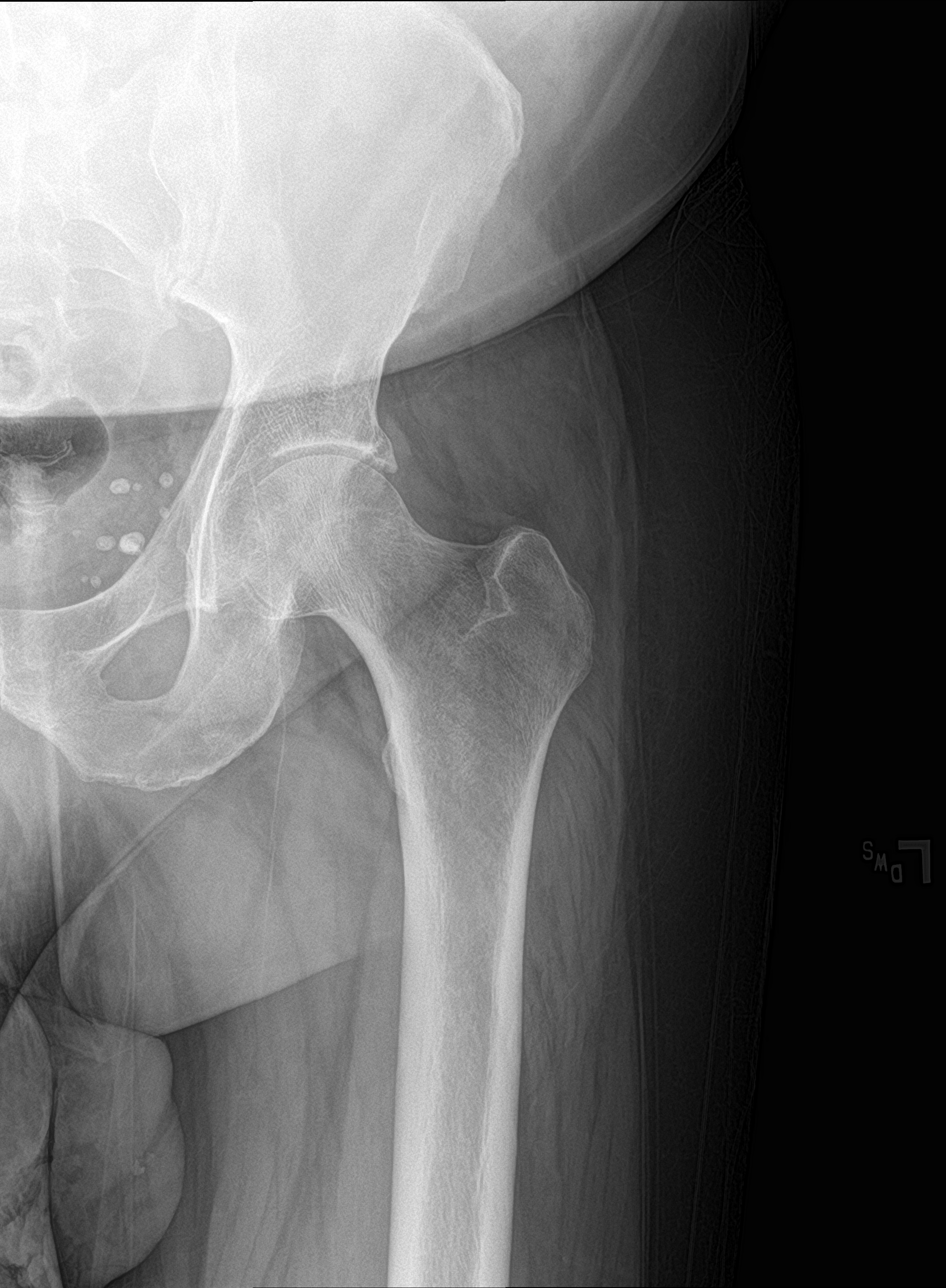

[femur ap (2 of 2)]
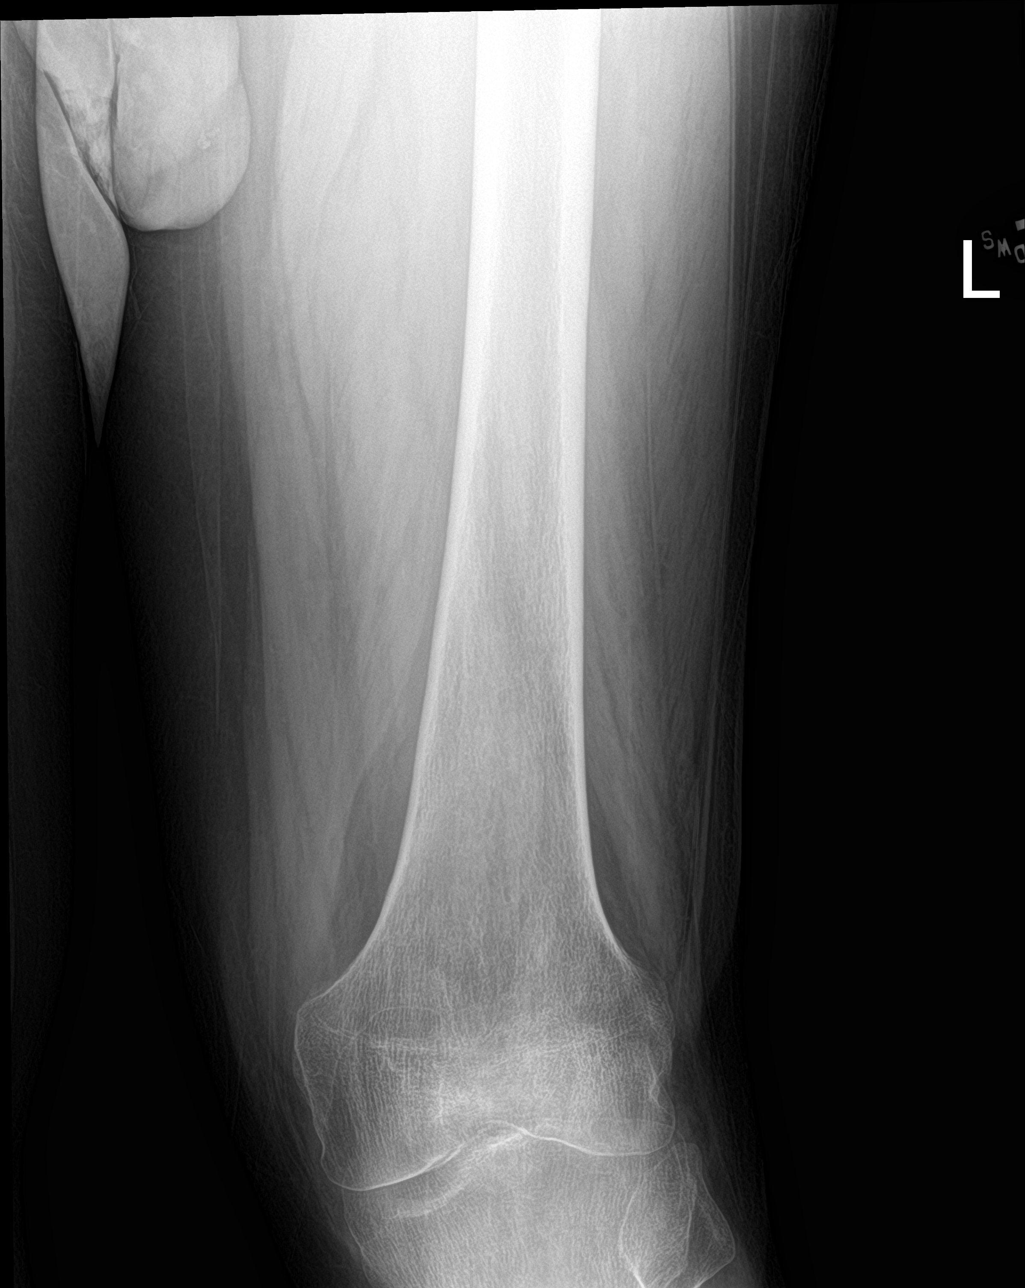

[femur lat (1 of 2)]
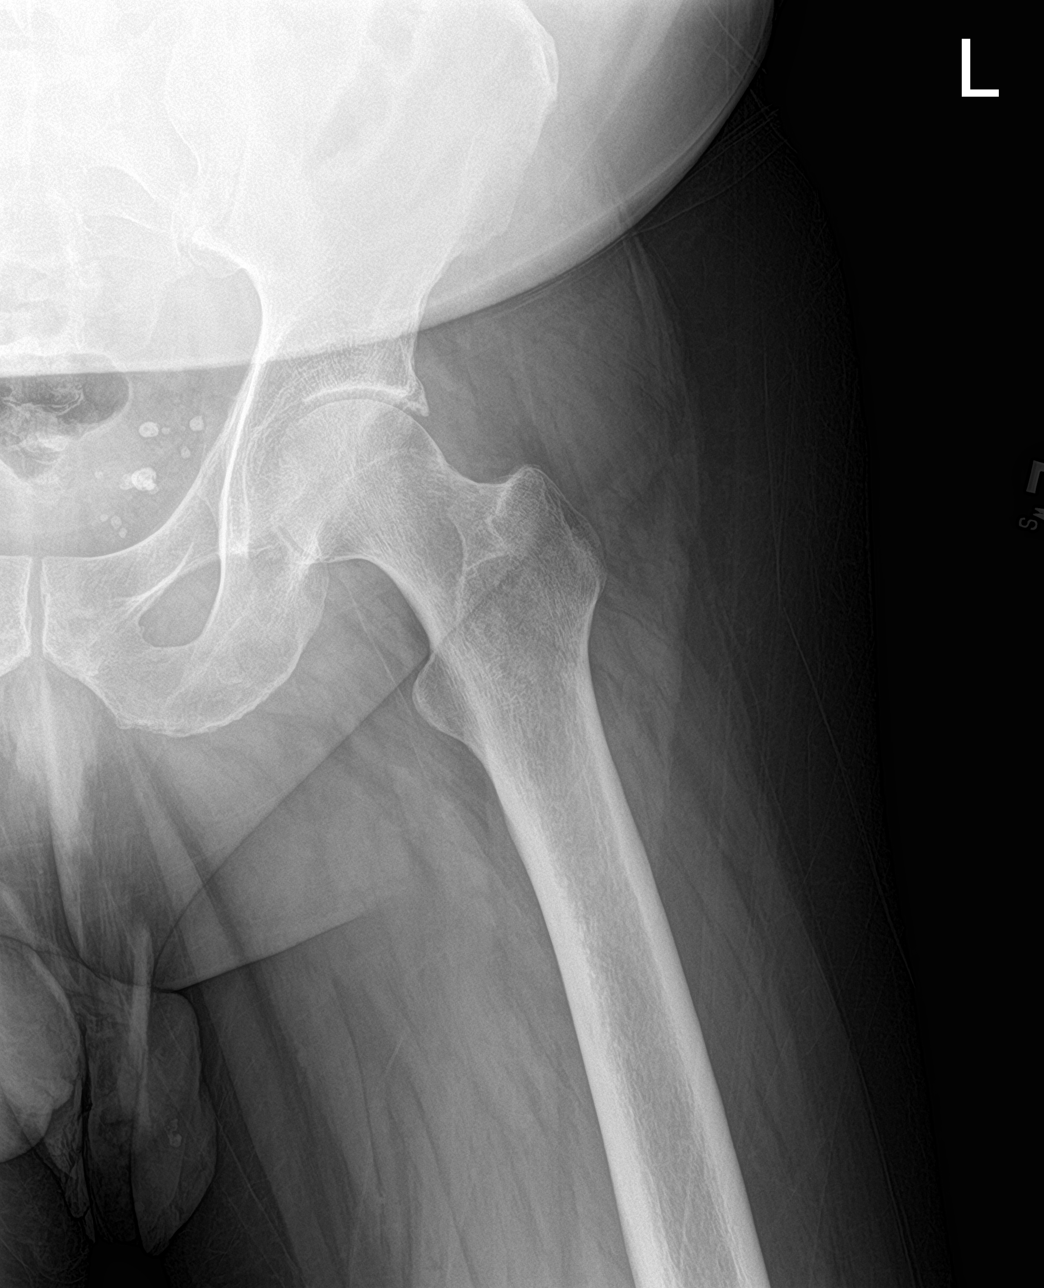

[femur lat (2 of 2)]
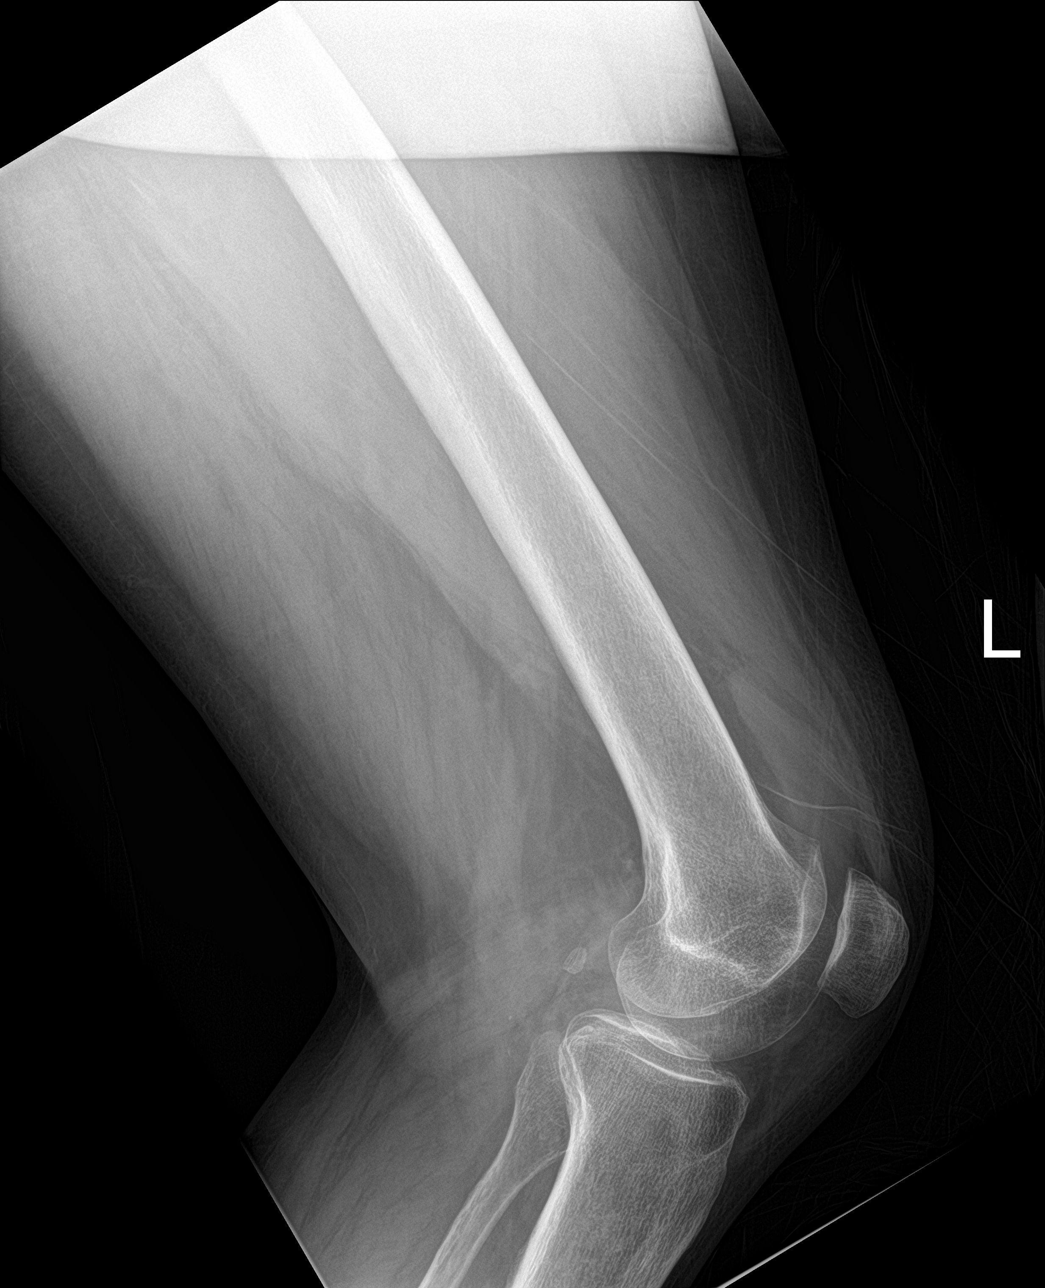

[4 of 4 positions shown; findings below may reference images not displayed]

FINDINGS: There is no evidence of fracture or other focal bone lesions. Soft
tissues are unremarkable.
IMPRESSION: Negative.

## 2022-12-15 ENCOUNTER — Other Ambulatory Visit: Payer: Self-pay

## 2022-12-15 ENCOUNTER — Emergency Department
Admission: EM | Admit: 2022-12-15 | Discharge: 2022-12-15 | Disposition: A | Discharge status: Home / Self Care | Payer: Medicaid Other | Attending: Emergency Medicine | Admitting: Emergency Medicine

## 2022-12-15 ENCOUNTER — Encounter: Payer: Self-pay | Admitting: Intensive Care

## 2022-12-15 DIAGNOSIS — Z7901 Long term (current) use of anticoagulants: Secondary | ICD-10-CM | POA: Insufficient documentation

## 2022-12-15 DIAGNOSIS — S01319A Laceration without foreign body of unspecified ear, initial encounter: Secondary | ICD-10-CM

## 2022-12-15 DIAGNOSIS — S01311A Laceration without foreign body of right ear, initial encounter: Secondary | ICD-10-CM | POA: Diagnosis not present

## 2022-12-15 DIAGNOSIS — S0991XA Unspecified injury of ear, initial encounter: Secondary | ICD-10-CM | POA: Diagnosis present

## 2022-12-15 DIAGNOSIS — Z951 Presence of aortocoronary bypass graft: Secondary | ICD-10-CM | POA: Diagnosis not present

## 2022-12-15 DIAGNOSIS — X58XXXA Exposure to other specified factors, initial encounter: Secondary | ICD-10-CM | POA: Diagnosis not present

## 2022-12-15 HISTORY — DX: Essential (primary) hypertension: I10

## 2022-12-15 HISTORY — DX: Pure hypercholesterolemia, unspecified: E78.00

## 2022-12-15 LAB — COMPREHENSIVE METABOLIC PANEL
ALT: 25 U/L (ref 0–44)
AST: 23 U/L (ref 15–41)
Albumin: 4.3 g/dL (ref 3.5–5.0)
Alkaline Phosphatase: 135 U/L — ABNORMAL HIGH (ref 38–126)
Anion gap: 6 (ref 5–15)
BUN: 7 mg/dL (ref 6–20)
CO2: 28 mmol/L (ref 22–32)
Calcium: 9.4 mg/dL (ref 8.9–10.3)
Chloride: 102 mmol/L (ref 98–111)
Creatinine, Ser: 0.81 mg/dL (ref 0.61–1.24)
GFR, Estimated: >60 mL/min (ref >60)
Glucose, Bld: 103 mg/dL — ABNORMAL HIGH (ref 70–99)
Potassium: 3.8 mmol/L (ref 3.5–5.1)
Sodium: 136 mmol/L (ref 135–145)
Total Bilirubin: 0.4 mg/dL (ref <1.2)
Total Protein: 8.3 g/dL — ABNORMAL HIGH (ref 6.5–8.1)

## 2022-12-15 LAB — CBC WITH DIFFERENTIAL/PLATELET
Abs Immature Granulocytes: 0.02 10*3/uL (ref 0.00–0.07)
Basophils Absolute: 0.1 10*3/uL (ref 0.0–0.1)
Basophils Relative: 1 %
Eosinophils Absolute: 0.2 10*3/uL (ref 0.0–0.5)
Eosinophils Relative: 2 %
HCT: 41.9 % (ref 39.0–52.0)
Hemoglobin: 13.8 g/dL (ref 13.0–17.0)
Immature Granulocytes: 0 %
Lymphocytes Relative: 32 %
Lymphs Abs: 2.5 10*3/uL (ref 0.7–4.0)
MCH: 29.8 pg (ref 26.0–34.0)
MCHC: 32.9 g/dL (ref 30.0–36.0)
MCV: 90.5 fL (ref 80.0–100.0)
Monocytes Absolute: 0.5 10*3/uL (ref 0.1–1.0)
Monocytes Relative: 7 %
Neutro Abs: 4.6 10*3/uL (ref 1.7–7.7)
Neutrophils Relative %: 58 %
Platelets: 576 10*3/uL — ABNORMAL HIGH (ref 150–400)
RBC: 4.63 MIL/uL (ref 4.22–5.81)
RDW: 13.2 % (ref 11.5–15.5)
WBC: 7.9 10*3/uL (ref 4.0–10.5)
nRBC: 0 % (ref 0.0–0.2)

## 2022-12-15 MED ORDER — CIPROFLOXACIN-DEXAMETHASONE 0.3-0.1 % OT SUSP: 4.0000 [drp] | Freq: Two times a day (BID) | OTIC | 0 refills | Status: AC

## 2022-12-15 NOTE — Discharge Instructions (Addendum)
Please only use the prescription for eardrops if you begin having pain in this right ear

## 2022-12-15 NOTE — ED Triage Notes (Signed)
Patient c/o right ear drainage that is bloody and started this evening. 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

## 2022-12-15 NOTE — ED Provider Notes (Signed)
[facility] Provider Note   Event Date/Time   First MD Initiated Contact with Patient 12/15/22 2138     (approximate) History  Ear Drainage (bloody)  HPI Corey Barnes is a 51 y.o. male who presents for bleeding from the right ear that began just prior to arrival.  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  Patient denies any pain in the external auditory canal and states that the bleeding has stopped at this time. ROS: Patient currently denies any vision changes, tinnitus, difficulty speaking, facial droop, sore throat, chest pain, shortness of breath, abdominal pain, nausea/vomiting/diarrhea, dysuria, or weakness/numbness/paresthesias in any extremity   Physical Exam  Triage Vital Signs: ED Triage Vitals [12/15/22 1819]  Encounter Vitals Group     BP (!) 138/94     Systolic BP Percentile      Diastolic BP Percentile      Pulse Rate 75     Resp 16     Temp 97.6 F (36.4 C)     Temp Source Oral     SpO2 100 %     Weight 200 lb (90.7 kg)     Height 5\' 10"  (1.778 m)     Head Circumference      Peak Flow      Pain Score 0     Pain Loc      Pain Education      Exclude from Growth Chart    Most recent vital signs: Vitals:   12/15/22 1819 12/15/22 2222  BP: (!) 138/94 129/82  Pulse: 75 75  Resp: 16 18  Temp: 97.6 F (36.4 C) 98.2 F (36.8 C)  SpO2: 100% 100%   General: Awake, oriented x4. CV:  Good peripheral perfusion.  Resp:  Normal effort.  Abd:  No distention.  Other:  Middle-aged well-developed, well-nourished resting comfortably in no acute distress.  Right external auditory canal with a small superficial laceration at the 10 o'clock position.  No active bleeding ED Results / Procedures / Treatments  Labs (all labs ordered are listed, but only abnormal results are displayed) Labs Reviewed  CBC WITH DIFFERENTIAL/PLATELET - Abnormal; Notable for the following components:      Result Value    Platelets 576 (*)    All other components within normal limits  COMPREHENSIVE METABOLIC PANEL - Abnormal; Notable for the following components:   Glucose, Bld 103 (*)    Total Protein 8.3 (*)    Alkaline Phosphatase 135 (*)    All other components within normal limits  PROCEDURES: Critical Care performed: No Procedures MEDICATIONS ORDERED IN ED: Medications - No data to display IMPRESSION / MDM / ASSESSMENT AND PLAN / ED COURSE  I reviewed the triage vital signs and the nursing notes.                             The patient is on the cardiac monitor to evaluate for evidence of arrhythmia and/or significant heart rate changes. Patient's presentation is most consistent with acute presentation with potential threat to life or bodily function. Patient is a 52 year old male who presents for right ear bleeding.  On exam patient has a small superficial laceration to the external auditory canal.  There is no signs of surrounding erythema or purulent drainage is a concern for a infection in this area.  Patient given an as needed prescription for Ciprodex drops in  case this becomes painful.  Patient encouraged to follow-up with his primary care physician for further evaluation and management.  Dispo: Discharge home with PCP follow-up   FINAL CLINICAL IMPRESSION(S) / ED DIAGNOSES   Final diagnoses:  Laceration of external auditory canal, initial encounter   Rx / DC Orders   ED Discharge Orders          Ordered    Ambulatory Referral to Primary Care (Establish Care)        12/15/22 2203    ciprofloxacin-dexamethasone (CIPRODEX) OTIC suspension  2 times daily        12/15/22 2203           Note:  This document was prepared using Dragon voice recognition software and may include unintentional dictation errors.   Merwyn Katos, MD 12/15/22 [phone]

## 2023-01-01 HISTORY — PX: CORONARY ARTERY BYPASS GRAFT: SHX141

## 2023-01-12 ENCOUNTER — Encounter: Payer: Self-pay | Admitting: *Deleted

## 2023-01-12 ENCOUNTER — Encounter: Payer: Medicaid Other | Attending: Cardiology | Admitting: *Deleted

## 2023-01-12 DIAGNOSIS — Z951 Presence of aortocoronary bypass graft: Secondary | ICD-10-CM | POA: Insufficient documentation

## 2023-01-12 DIAGNOSIS — Z48812 Encounter for surgical aftercare following surgery on the circulatory system: Secondary | ICD-10-CM | POA: Insufficient documentation

## 2023-01-12 NOTE — Progress Notes (Addendum)
Virtual orientation call completed today. he has an appointment on Date: 01/16/2023  for EP eval and gym Orientation.  Documentation of diagnosis can be found in [facility] Date: 10/30/204 .  Corey Barnes is a former tobacco user. Intervention for tobacco cessation was provided at the initial medical review. He was asked about when he  quit and reported 11/14/2022 right before his surgery . Patient was advised and educated about tobacco cessation using combination therapy, tobacco cessation classes, quit line, and quit smoking apps. Patient demonstrated understanding of this material. Staff will continue to provide encouragement and follow up with the patient throughout the program.

## 2023-01-16 VITALS — Ht 71.0 in | Wt 210.3 lb

## 2023-01-16 DIAGNOSIS — Z951 Presence of aortocoronary bypass graft: Secondary | ICD-10-CM | POA: Diagnosis not present

## 2023-01-16 DIAGNOSIS — Z48812 Encounter for surgical aftercare following surgery on the circulatory system: Secondary | ICD-10-CM | POA: Diagnosis present

## 2023-01-16 NOTE — Progress Notes (Signed)
Cardiac Individual Treatment Plan  Patient Details  Name: Corey Barnes MRN: [id number] Date of Birth: 1970-05-05 Referring Provider:   Flowsheet Row Cardiac Rehab from 01/16/2023 in [facility] Cardiac and Pulmonary Rehab  Referring Provider Dr. Massie Maroon, MD       Initial Encounter Date:  Flowsheet Row Cardiac Rehab from 01/16/2023 in [facility] Cardiac and Pulmonary Rehab  Date 01/16/23       Visit Diagnosis: S/P CABG x 2  Patient's Home Medications on Admission:  Current Outpatient Medications:    acetaminophen (TYLENOL) 500 MG tablet, Take 500 mg by mouth every 8 (eight) hours as needed., Disp: , Rfl:    albuterol (PROVENTIL HFA;VENTOLIN HFA) 108 (90 Base) MCG/ACT inhaler, Inhale 2 puffs into the lungs every 6 (six) hours as needed for wheezing or shortness of breath. (Patient not taking: Reported on 01/12/2023), Disp: 1 Inhaler, Rfl: 0   aspirin EC 81 MG tablet, Take 1 tablet by mouth daily., Disp: , Rfl:    atorvastatin (LIPITOR) 20 MG tablet, Take 1 tablet (20 mg total) by mouth daily. (Patient not taking: Reported on 01/12/2023), Disp: 90 tablet, Rfl: 1   ciprofloxacin-dexamethasone (CIPRODEX) OTIC suspension, Place 4 drops into the right ear 2 (two) times daily. (Patient not taking: Reported on 01/12/2023), Disp: 7.5 mL, Rfl: 0   diltiazem (CARDIZEM CD) 120 MG 24 hr capsule, Take 1 capsule by mouth daily., Disp: , Rfl:    ezetimibe (ZETIA) 10 MG tablet, Take 10 mg by mouth daily., Disp: , Rfl:    hydrOXYzine (ATARAX) 25 MG tablet, Take 1 tablet (25 mg total) by mouth 3 (three) times daily as needed for anxiety., Disp: 20 tablet, Rfl: 0   losartan (COZAAR) 50 MG tablet, TAKE 1 TABLET BY MOUTH EVERY DAY (Patient not taking: Reported on 01/12/2023), Disp: 90 tablet, Rfl: 1   meloxicam (MOBIC) 15 MG tablet, Take 1 tablet (15 mg total) by mouth daily as needed for pain. (Patient not taking: Reported on 01/12/2023), Disp: 30 tablet, Rfl: 0   metoprolol succinate (TOPROL-XL) 25 MG 24 hr  tablet, Take 12.5 mg by mouth daily., Disp: , Rfl:    naproxen (NAPROSYN) 500 MG tablet, Take 1 tablet (500 mg total) by mouth 2 (two) times daily with a meal. (Patient not taking: Reported on 01/12/2023), Disp: 20 tablet, Rfl: 0   REPATHA SURECLICK 140 MG/ML SOAJ, Inject 140 mg into the skin every 14 (fourteen) days., Disp: , Rfl:   Past Medical History: Past Medical History:  Diagnosis Date   High cholesterol    Hypertension    Neck pain 04/04/2022   Pain of both sacroiliac joints 04/04/2022    Tobacco Use: Social History   Tobacco Use  Smoking Status Former   Current packs/day: 0.50   Average packs/day: 0.5 packs/day for 32.0 years (16.0 ttl pk-yrs)   Types: Cigarettes  Smokeless Tobacco Never    Labs: Review Flowsheet       Latest Ref Rng & Units 04/04/2022  Labs for ITP Cardiac and Pulmonary Rehab  Cholestrol 100 - 199 mg/dL 409   LDL (calc) 0 - 99 mg/dL 811   HDL-C >91 mg/dL 34   Trlycerides 0 - 478 mg/dL 295   Hemoglobin A2Z 4.8 - 5.6 % 5.7      Exercise Target Goals: Exercise Program Goal: Individual exercise prescription set using results from initial 6 min walk test and THRR while considering  patient's activity barriers and safety.   Exercise Prescription Goal: Initial exercise prescription builds to 30-45  minutes a day of aerobic activity, 2-3 days per week.  Home exercise guidelines will be given to patient during program as part of exercise prescription that the participant will acknowledge.   Education: Aerobic Exercise: - Group verbal and visual presentation on the components of exercise prescription. Introduces F.I.T.T principle from ACSM for exercise prescriptions.  Reviews F.I.T.T. principles of aerobic exercise including progression. Written material given at graduation. Flowsheet Row Cardiac Rehab from 01/16/2023 in [facility] Cardiac and Pulmonary Rehab  Education need identified 01/16/23       Education: Resistance Exercise: - Group verbal and  visual presentation on the components of exercise prescription. Introduces F.I.T.T principle from ACSM for exercise prescriptions  Reviews F.I.T.T. principles of resistance exercise including progression. Written material given at graduation.    Education: Exercise & Equipment Safety: - Individual verbal instruction and demonstration of equipment use and safety with use of the equipment. Flowsheet Row Cardiac Rehab from 01/16/2023 in [facility] Cardiac and Pulmonary Rehab  Date 01/16/23  Educator NT  Instruction Review Code 1- Verbalizes Understanding       Education: Exercise Physiology & General Exercise Guidelines: - Group verbal and written instruction with models to review the exercise physiology of the cardiovascular system and associated critical values. Provides general exercise guidelines with specific guidelines to those with heart or lung disease.    Education: Flexibility, Balance, Mind/Body Relaxation: - Group verbal and visual presentation with interactive activity on the components of exercise prescription. Introduces F.I.T.T principle from ACSM for exercise prescriptions. Reviews F.I.T.T. principles of flexibility and balance exercise training including progression. Also discusses the mind body connection.  Reviews various relaxation techniques to help reduce and manage stress (i.e. Deep breathing, progressive muscle relaxation, and visualization). Balance handout provided to take home. Written material given at graduation.   Activity Barriers & Risk Stratification:  Activity Barriers & Cardiac Risk Stratification - 01/16/23 1557       Activity Barriers & Cardiac Risk Stratification   Activity Barriers Joint Problems;Shortness of Breath;Neck/Spine Problems;Balance Concerns;History of Falls   L knee 30% disabled, R knee weak at times, neck hure in accident in past   Cardiac Risk Stratification High             6 Minute Walk:  6 Minute Walk     Row Name 01/16/23 1610          6 Minute Walk   Phase Initial     Distance 700 feet     Walk Time 6 minutes     # of Rest Breaks 0     MPH 1.33     METS 3.07     RPE 9     Perceived Dyspnea  2     VO2 Peak 10.74     Symptoms Yes (comment)     Comments knee pain 8/10, limp     Resting HR 98 bpm     Resting BP 122/82     Resting Oxygen Saturation  97 %     Exercise Oxygen Saturation  during 6 min walk 97 %     Max Ex. HR 114 bpm     Max Ex. BP 136/86     2 Minute Post BP 120/78              Oxygen Initial Assessment:   Oxygen Re-Evaluation:   Oxygen Discharge (Final Oxygen Re-Evaluation):   Initial Exercise Prescription:  Initial Exercise Prescription - 01/16/23 1600       Date of Initial Exercise  RX and Referring Provider   Date 01/16/23    Referring Provider Dr. Massie Maroon, MD      Oxygen   Maintain Oxygen Saturation 88% or higher      Treadmill   MPH 1.3    Grade 0    Minutes 15    METs 2      NuStep   Level 2    SPM 80    Minutes 15    METs 3.07      Biostep-RELP   Level 1    SPM 50    Minutes 15    METs 3.07      Track   Laps 15    Minutes 15    METs 1.82      Prescription Details   Frequency (times per week) 2    Duration Progress to 30 minutes of continuous aerobic without signs/symptoms of physical distress      Intensity   THRR 40-80% of Max Heartrate 126-155    Ratings of Perceived Exertion 11-15    Perceived Dyspnea 0-4      Progression   Progression Continue to progress workloads to maintain intensity without signs/symptoms of physical distress.      Resistance Training   Training Prescription Yes    Weight 4 lb    Reps 10-15             Perform Capillary Blood Glucose checks as needed.  Exercise Prescription Changes:   Exercise Prescription Changes     Row Name 01/16/23 1600             Response to Exercise   Blood Pressure (Admit) 122/82       Blood Pressure (Exercise) 136/86       Blood Pressure (Exit) 120/78       Heart  Rate (Admit) 98 bpm       Heart Rate (Exercise) 114 bpm       Heart Rate (Exit) 97 bpm       Oxygen Saturation (Admit) 97 %       Oxygen Saturation (Exercise) 97 %       Rating of Perceived Exertion (Exercise) 9       Perceived Dyspnea (Exercise) 2       Symptoms knee pain 8/10, limp       Comments Results                Exercise Comments:   Exercise Goals and Review:   Exercise Goals     Row Name 01/16/23 1558             Exercise Goals   Increase Physical Activity Yes       Intervention Provide advice, education, support and counseling about physical activity/exercise needs.;Develop an individualized exercise prescription for aerobic and resistive training based on initial evaluation findings, risk stratification, comorbidities and participant's personal goals.       Expected Outcomes Short Term: Attend rehab on a regular basis to increase amount of physical activity.;Long Term: Add in home exercise to make exercise part of routine and to increase amount of physical activity.;Long Term: Exercising regularly at least 3-5 days a week.       Increase Strength and Stamina Yes       Intervention Provide advice, education, support and counseling about physical activity/exercise needs.;Develop an individualized exercise prescription for aerobic and resistive training based on initial evaluation findings, risk stratification, comorbidities and participant's personal goals.  Expected Outcomes Short Term: Perform resistance training exercises routinely during rehab and add in resistance training at home;Long Term: Improve cardiorespiratory fitness, muscular endurance and strength as measured by increased METs and functional capacity ( );Short Term: Increase workloads from initial exercise prescription for resistance, speed, and METs.       Able to understand and use rate of perceived exertion (RPE) scale Yes       Intervention Provide education and explanation on how to use  RPE scale       Expected Outcomes Short Term: Able to use RPE daily in rehab to express subjective intensity level;Long Term:  Able to use RPE to guide intensity level when exercising independently       Able to understand and use Dyspnea scale Yes       Intervention Provide education and explanation on how to use Dyspnea scale       Expected Outcomes Short Term: Able to use Dyspnea scale daily in rehab to express subjective sense of shortness of breath during exertion;Long Term: Able to use Dyspnea scale to guide intensity level when exercising independently       Knowledge and understanding of Target Heart Rate Range (THRR) Yes       Intervention Provide education and explanation of THRR including how the numbers were predicted and where they are located for reference       Expected Outcomes Short Term: Able to state/look up THRR;Long Term: Able to use THRR to govern intensity when exercising independently;Short Term: Able to use daily as guideline for intensity in rehab       Able to check pulse independently Yes       Intervention Provide education and demonstration on how to check pulse in carotid and radial arteries.;Review the importance of being able to check your own pulse for safety during independent exercise       Expected Outcomes Short Term: Able to explain why pulse checking is important during independent exercise;Long Term: Able to check pulse independently and accurately       Understanding of Exercise Prescription Yes       Intervention Provide education, explanation, and written materials on patient's individual exercise prescription       Expected Outcomes Short Term: Able to explain program exercise prescription;Long Term: Able to explain home exercise prescription to exercise independently                Exercise Goals Re-Evaluation :   Discharge Exercise Prescription (Final Exercise Prescription Changes):  Exercise Prescription Changes - 01/16/23 1600        Response to Exercise   Blood Pressure (Admit) 122/82    Blood Pressure (Exercise) 136/86    Blood Pressure (Exit) 120/78    Heart Rate (Admit) 98 bpm    Heart Rate (Exercise) 114 bpm    Heart Rate (Exit) 97 bpm    Oxygen Saturation (Admit) 97 %    Oxygen Saturation (Exercise) 97 %    Rating of Perceived Exertion (Exercise) 9    Perceived Dyspnea (Exercise) 2    Symptoms knee pain 8/10, limp    Comments Results             Nutrition:  Target Goals: Understanding of nutrition guidelines, daily intake of sodium 1500mg , cholesterol 200mg , calories 30% from fat and 7% or less from saturated fats, daily to have 5 or more servings of fruits and vegetables.  Education: All About Nutrition: -Group instruction provided by verbal, written material, interactive activities, discussions,  models, and posters to present general guidelines for heart healthy nutrition including fat, fiber, MyPlate, the role of sodium in heart healthy nutrition, utilization of the nutrition label, and utilization of this knowledge for meal planning. Follow up email sent as well. Written material given at graduation. Flowsheet Row Cardiac Rehab from 01/16/2023 in [facility] Cardiac and Pulmonary Rehab  Education need identified 01/16/23       Biometrics:  Pre Biometrics - 01/16/23 1613       Pre Biometrics   Height 5\' 11"  (1.803 m)    Weight 210 lb 4.8 oz (95.4 kg)    Waist Circumference 40 inches    Hip Circumference 42 inches    Waist to Hip Ratio 0.95 %    BMI (Calculated) 29.34    Single Leg Stand 10.5 seconds             Post Biometrics - 01/16/23 1559        Post  Biometrics   Height --    Weight --    Waist Circumference --    Hip Circumference --    Waist to Hip Ratio --    BMI (Calculated) --    Single Leg Stand --             Nutrition Therapy Plan and Nutrition Goals:   Nutrition Assessments:  MEDIFICTS Score Key: >=70 Need to make dietary changes  40-70 Heart Healthy  Diet <= 40 Therapeutic Level Cholesterol Diet  Flowsheet Row Cardiac Rehab from 01/12/2023 in [facility] Cardiac and Pulmonary Rehab  Picture Your Plate Total Score on Admission 59      Picture Your Plate Scores: <40 Unhealthy dietary pattern with much room for improvement. 41-50 Dietary pattern unlikely to meet recommendations for good health and room for improvement. 51-60 More healthful dietary pattern, with some room for improvement.  >60 Healthy dietary pattern, although there may be some specific behaviors that could be improved.    Nutrition Goals Re-Evaluation:   Nutrition Goals Discharge (Final Nutrition Goals Re-Evaluation):   Psychosocial: Target Goals: Acknowledge presence or absence of significant depression and/or stress, maximize coping skills, provide positive support system. Participant is able to verbalize types and ability to use techniques and skills needed for reducing stress and depression.   Education: Stress, Anxiety, and Depression - Group verbal and visual presentation to define topics covered.  Reviews how body is impacted by stress, anxiety, and depression.  Also discusses healthy ways to reduce stress and to treat/manage anxiety and depression.  Written material given at graduation.   Education: Sleep Hygiene -Provides group verbal and written instruction about how sleep can affect your health.  Define sleep hygiene, discuss sleep cycles and impact of sleep habits. Review good sleep hygiene tips.    Initial Review & Psychosocial Screening:  Initial Psych Review & Screening - 01/12/23 1050       Initial Review   Current issues with Current Anxiety/Panic;Current Stress Concerns    Source of Stress Concerns Financial    Comments out of work  a year working on disability.   prescibed meds for anxiety.  is working on trting to redirect thoughts to decrease anxiety      Family Dynamics   Good Support System? Yes   oldest kid, Mom, some friends     Barriers    Psychosocial barriers to participate in program There are no identifiable barriers or psychosocial needs.      Screening Interventions   Interventions Encouraged to exercise  Expected Outcomes Short Term goal: Utilizing psychosocial counselor, staff and physician to assist with identification of specific Stressors or current issues interfering with healing process. Setting desired goal for each stressor or current issue identified.;Long Term Goal: Stressors or current issues are controlled or eliminated.;Short Term goal: Identification and review with participant of any Quality of Life or Depression concerns found by scoring the questionnaire.;Long Term goal: The participant improves quality of Life and PHQ9 Scores as seen by post scores and/or verbalization of changes             Quality of Life Scores:   Quality of Life - 01/12/23 1115       Quality of Life   Select Quality of Life      Quality of Life Scores   Health/Function Pre 12.94 %    Socioeconomic Pre 11.13 %    Psych/Spiritual Pre 13.75 %    Family Pre 13.64 %    GLOBAL Pre 16.1 %            Scores of 19 and below usually indicate a poorer quality of life in these areas.  A difference of  2-3 points is a clinically meaningful difference.  A difference of 2-3 points in the total score of the Quality of Life Index has been associated with significant improvement in overall quality of life, self-image, physical symptoms, and general health in studies assessing change in quality of life.  PHQ-9: Review Flowsheet       01/16/2023  Depression screen PHQ 2/9  Decreased Interest 3  Down, Depressed, Hopeless 2  PHQ - 2 Score 5  Altered sleeping 3  Tired, decreased energy 2  Change in appetite 2  Feeling bad or failure about yourself  1  Trouble concentrating 2  Moving slowly or fidgety/restless 0  Suicidal thoughts 0  PHQ-9 Score 15  Difficult doing work/chores Somewhat difficult   Interpretation of Total  Score  Total Score Depression Severity:  1-4 = Minimal depression, 5-9 = Mild depression, 10-14 = Moderate depression, 15-19 = Moderately severe depression, 20-27 = Severe depression   Psychosocial Evaluation and Intervention:  Psychosocial Evaluation - 01/12/23 1109       Psychosocial Evaluation & Interventions   Interventions Encouraged to exercise with the program and follow exercise prescription;Relaxation education    Comments There are no barriers to attending the program. His Mom lives with him. She and his older son and smoe friends are his support.  He does have a history of anxiety and has meds to help as needed. He stated that he tries to think about other things to decrease the anxiety. He does have finacial concern as he has been out of work about a year. He is working with a Clinical research associate to get diasability.  He is ready to start the program and work on feeling better    Expected Outcomes STG attend all sessions as scheduled, continue to work on methods to decrease anxiety response.  LTG able to manage anxiety symptoms    Continue Psychosocial Services  Follow up required by staff             Psychosocial Re-Evaluation:   Psychosocial Discharge (Final Psychosocial Re-Evaluation):   Vocational Rehabilitation: Provide vocational rehab assistance to qualifying candidates.   Vocational Rehab Evaluation & Intervention:  Vocational Rehab - 01/12/23 1113       Initial Vocational Rehab Evaluation & Intervention   Assessment shows need for Vocational Rehabilitation No      Vocational Rehab  Re-Evaulation   Comments applying for disability             Education: Education Goals: Education classes will be provided on a variety of topics geared toward better understanding of heart health and risk factor modification. Participant will state understanding/return demonstration of topics presented as noted by education test scores.  Learning Barriers/Preferences:   General  Cardiac Education Topics:  AED/CPR: - Group verbal and written instruction with the use of models to demonstrate the basic use of the AED with the basic ABC's of resuscitation.   Anatomy and Cardiac Procedures: - Group verbal and visual presentation and models provide information about basic cardiac anatomy and function. Reviews the testing methods done to diagnose heart disease and the outcomes of the test results. Describes the treatment choices: Medical Management, Angioplasty, or Coronary Bypass Surgery for treating various heart conditions including Myocardial Infarction, Angina, Valve Disease, and Cardiac Arrhythmias.  Written material given at graduation.   Medication Safety: - Group verbal and visual instruction to review commonly prescribed medications for heart and lung disease. Reviews the medication, class of the drug, and side effects. Includes the steps to properly store meds and maintain the prescription regimen.  Written material given at graduation.   Intimacy: - Group verbal instruction through game format to discuss how heart and lung disease can affect sexual intimacy. Written material given at graduation..   Know Your Numbers and Heart Failure: - Group verbal and visual instruction to discuss disease risk factors for cardiac and pulmonary disease and treatment options.  Reviews associated critical values for Overweight/Obesity, Hypertension, Cholesterol, and Diabetes.  Discusses basics of heart failure: signs/symptoms and treatments.  Introduces Heart Failure Zone chart for action plan for heart failure.  Written material given at graduation. Flowsheet Row Cardiac Rehab from 01/16/2023 in [facility] Cardiac and Pulmonary Rehab  Education need identified 01/16/23       Infection Prevention: - Provides verbal and written material to individual with discussion of infection control including proper hand washing and proper equipment cleaning during exercise session. Flowsheet  Row Cardiac Rehab from 01/16/2023 in [facility] Cardiac and Pulmonary Rehab  Date 01/16/23  Educator NT  Instruction Review Code 1- Verbalizes Understanding       Falls Prevention: - Provides verbal and written material to individual with discussion of falls prevention and safety. Flowsheet Row Cardiac Rehab from 01/16/2023 in [facility] Cardiac and Pulmonary Rehab  Date 01/12/23  Educator SB  Instruction Review Code 1- Verbalizes Understanding       Other: -Provides group and verbal instruction on various topics (see comments)   Knowledge Questionnaire Score:  Knowledge Questionnaire Score - 01/12/23 1117       Knowledge Questionnaire Score   Pre Score 23/26  missed NTG, nutrition and exercise             Core Components/Risk Factors/Patient Goals at Admission:  Personal Goals and Risk Factors at Admission - 01/12/23 1138       Core Components/Risk Factors/Patient Goals on Admission   Number of packs per day [name] is a former tobacco user. Intervention for tobacco cessation was provided at the initial medical review. He was asked about when he  quit and reported 11/14/2022 right before his surgery . Patient was advised and educated about tobacco cessation using combination therapy, tobacco cessation classes, quit line, and quit smoking apps. Patient demonstrated understanding of this material. Staff will continue to provide encouragement and follow up with the patient throughout the progra  Education:Diabetes - Individual verbal and written instruction to review signs/symptoms of diabetes, desired ranges of glucose level fasting, after meals and with exercise. Acknowledge that pre and post exercise glucose checks will be done for 3 sessions at entry of program.   Core Components/Risk Factors/Patient Goals Review:    Core Components/Risk Factors/Patient Goals at Discharge (Final Review):    ITP Comments:  ITP Comments     Row Name 01/12/23 1102 01/16/23  1551         ITP Comments Virtual orientation call completed today. he has an appointment on Date: 01/16/2023  for EP eval and gym Orientation.  Documentation of diagnosis can be found in [facility] Date: 10/30/204 . Completed and gym orientation. Initial ITP created and sent for review to Dr. Bethann Punches, Medical Director.               Comments: Initial ITP

## 2023-01-16 NOTE — Patient Instructions (Signed)
Patient Instructions  Patient Details  Name: Corey Barnes MRN: [id number] Date of Birth: 06/25/1970 Referring Provider:  Lovenia Shuck, MD  Below are your personal goals for exercise, nutrition, and risk factors. Our goal is to help you stay on track towards obtaining and maintaining these goals. We will be discussing your progress on these goals with you throughout the program.  Initial Exercise Prescription:  Initial Exercise Prescription - 01/16/23 1600       Date of Initial Exercise RX and Referring Provider   Date 01/16/23    Referring Provider Dr. Massie Maroon, MD      Oxygen   Maintain Oxygen Saturation 88% or higher      Treadmill   MPH 1.3    Grade 0    Minutes 15    METs 2      NuStep   Level 2    SPM 80    Minutes 15    METs 3.07      Biostep-RELP   Level 1    SPM 50    Minutes 15    METs 3.07      Track   Laps 15    Minutes 15    METs 1.82      Prescription Details   Frequency (times per week) 2    Duration Progress to 30 minutes of continuous aerobic without signs/symptoms of physical distress      Intensity   THRR 40-80% of Max Heartrate 126-155    Ratings of Perceived Exertion 11-15    Perceived Dyspnea 0-4      Progression   Progression Continue to progress workloads to maintain intensity without signs/symptoms of physical distress.      Resistance Training   Training Prescription Yes    Weight 4 lb    Reps 10-15             Exercise Goals: Frequency: Be able to perform aerobic exercise two to three times per week in program working toward 2-5 days per week of home exercise.  Intensity: Work with a perceived exertion of 11 (fairly light) - 15 (hard) while following your exercise prescription.  We will make changes to your prescription with you as you progress through the program.   Duration: Be able to do 30 to 45 minutes of continuous aerobic exercise in addition to a 5 minute warm-up and a 5 minute cool-down routine.    Nutrition Goals: Your personal nutrition goals will be established when you do your nutrition analysis with the dietician.  The following are general nutrition guidelines to follow: Cholesterol < 200mg /day Sodium < 1500mg /day Fiber: Men over 50 yrs - 30 grams per day  Personal Goals:  Personal Goals and Risk Factors at Admission - 01/12/23 1138       Core Components/Risk Factors/Patient Goals on Admission   Number of packs per day Corey Barnes is a former tobacco user. Intervention for tobacco cessation was provided at the initial medical review. He was asked about when he  quit and reported 11/14/2022 right before his surgery . Patient was advised and educated about tobacco cessation using combination therapy, tobacco cessation classes, quit line, and quit smoking apps. Patient demonstrated understanding of this material. Staff will continue to provide encouragement and follow up with the patient throughout the progra            Exercise Goals and Review:  Exercise Goals     Row Name 01/16/23 1558  Exercise Goals   Increase Physical Activity Yes       Intervention Provide advice, education, support and counseling about physical activity/exercise needs.;Develop an individualized exercise prescription for aerobic and resistive training based on initial evaluation findings, risk stratification, comorbidities and participant's personal goals.       Expected Outcomes Short Term: Attend rehab on a regular basis to increase amount of physical activity.;Long Term: Add in home exercise to make exercise part of routine and to increase amount of physical activity.;Long Term: Exercising regularly at least 3-5 days a week.       Increase Strength and Stamina Yes       Intervention Provide advice, education, support and counseling about physical activity/exercise needs.;Develop an individualized exercise prescription for aerobic and resistive training based on initial evaluation findings,  risk stratification, comorbidities and participant's personal goals.       Expected Outcomes Short Term: Perform resistance training exercises routinely during rehab and add in resistance training at home;Long Term: Improve cardiorespiratory fitness, muscular endurance and strength as measured by increased METs and functional capacity ( );Short Term: Increase workloads from initial exercise prescription for resistance, speed, and METs.       Able to understand and use rate of perceived exertion (RPE) scale Yes       Intervention Provide education and explanation on how to use RPE scale       Expected Outcomes Short Term: Able to use RPE daily in rehab to express subjective intensity level;Long Term:  Able to use RPE to guide intensity level when exercising independently       Able to understand and use Dyspnea scale Yes       Intervention Provide education and explanation on how to use Dyspnea scale       Expected Outcomes Short Term: Able to use Dyspnea scale daily in rehab to express subjective sense of shortness of breath during exertion;Long Term: Able to use Dyspnea scale to guide intensity level when exercising independently       Knowledge and understanding of Target Heart Rate Range (THRR) Yes       Intervention Provide education and explanation of THRR including how the numbers were predicted and where they are located for reference       Expected Outcomes Short Term: Able to state/look up THRR;Long Term: Able to use THRR to govern intensity when exercising independently;Short Term: Able to use daily as guideline for intensity in rehab       Able to check pulse independently Yes       Intervention Provide education and demonstration on how to check pulse in carotid and radial arteries.;Review the importance of being able to check your own pulse for safety during independent exercise       Expected Outcomes Short Term: Able to explain why pulse checking is important during independent  exercise;Long Term: Able to check pulse independently and accurately       Understanding of Exercise Prescription Yes       Intervention Provide education, explanation, and written materials on patient's individual exercise prescription       Expected Outcomes Short Term: Able to explain program exercise prescription;Long Term: Able to explain home exercise prescription to exercise independently

## 2023-01-18 ENCOUNTER — Ambulatory Visit: Payer: Medicaid Other

## 2023-01-18 ENCOUNTER — Encounter: Payer: Self-pay | Admitting: *Deleted

## 2023-01-18 DIAGNOSIS — Z951 Presence of aortocoronary bypass graft: Secondary | ICD-10-CM

## 2023-01-18 DIAGNOSIS — Z48812 Encounter for surgical aftercare following surgery on the circulatory system: Secondary | ICD-10-CM | POA: Diagnosis not present

## 2023-01-18 NOTE — Progress Notes (Signed)
Cardiac Individual Treatment Plan  Patient Details  Name: Corey Barnes MRN: [id number] Date of Birth: 11/01/1970 Referring Provider:   Flowsheet Row Cardiac Rehab from 01/16/2023 in [facility] Cardiac and Pulmonary Rehab  Referring Provider Dr. Massie Maroon, MD       Initial Encounter Date:  Flowsheet Row Cardiac Rehab from 01/16/2023 in [facility] Cardiac and Pulmonary Rehab  Date 01/16/23       Visit Diagnosis: S/P CABG x 2  Patient's Home Medications on Admission:  Current Outpatient Medications:    acetaminophen (TYLENOL) 500 MG tablet, Take 500 mg by mouth every 8 (eight) hours as needed., Disp: , Rfl:    albuterol (PROVENTIL HFA;VENTOLIN HFA) 108 (90 Base) MCG/ACT inhaler, Inhale 2 puffs into the lungs every 6 (six) hours as needed for wheezing or shortness of breath. (Patient not taking: Reported on 01/12/2023), Disp: 1 Inhaler, Rfl: 0   aspirin EC 81 MG tablet, Take 1 tablet by mouth daily., Disp: , Rfl:    atorvastatin (LIPITOR) 20 MG tablet, Take 1 tablet (20 mg total) by mouth daily. (Patient not taking: Reported on 01/12/2023), Disp: 90 tablet, Rfl: 1   ciprofloxacin-dexamethasone (CIPRODEX) OTIC suspension, Place 4 drops into the right ear 2 (two) times daily. (Patient not taking: Reported on 01/12/2023), Disp: 7.5 mL, Rfl: 0   diltiazem (CARDIZEM CD) 120 MG 24 hr capsule, Take 1 capsule by mouth daily., Disp: , Rfl:    ezetimibe (ZETIA) 10 MG tablet, Take 10 mg by mouth daily., Disp: , Rfl:    hydrOXYzine (ATARAX) 25 MG tablet, Take 1 tablet (25 mg total) by mouth 3 (three) times daily as needed for anxiety., Disp: 20 tablet, Rfl: 0   losartan (COZAAR) 50 MG tablet, TAKE 1 TABLET BY MOUTH EVERY DAY (Patient not taking: Reported on 01/12/2023), Disp: 90 tablet, Rfl: 1   meloxicam (MOBIC) 15 MG tablet, Take 1 tablet (15 mg total) by mouth daily as needed for pain. (Patient not taking: Reported on 01/12/2023), Disp: 30 tablet, Rfl: 0   metoprolol succinate (TOPROL-XL) 25 MG 24 hr  tablet, Take 12.5 mg by mouth daily., Disp: , Rfl:    naproxen (NAPROSYN) 500 MG tablet, Take 1 tablet (500 mg total) by mouth 2 (two) times daily with a meal. (Patient not taking: Reported on 01/12/2023), Disp: 20 tablet, Rfl: 0   REPATHA SURECLICK 140 MG/ML SOAJ, Inject 140 mg into the skin every 14 (fourteen) days., Disp: , Rfl:   Past Medical History: Past Medical History:  Diagnosis Date   High cholesterol    Hypertension    Neck pain 04/04/2022   Pain of both sacroiliac joints 04/04/2022    Tobacco Use: Social History   Tobacco Use  Smoking Status Former   Current packs/day: 0.50   Average packs/day: 0.5 packs/day for 32.0 years (16.0 ttl pk-yrs)   Types: Cigarettes  Smokeless Tobacco Never    Labs: Review Flowsheet       Latest Ref Rng & Units 04/04/2022  Labs for ITP Cardiac and Pulmonary Rehab  Cholestrol 100 - 199 mg/dL 914   LDL (calc) 0 - 99 mg/dL 782   HDL-C >95 mg/dL 34   Trlycerides 0 - 621 mg/dL 308   Hemoglobin M5H 4.8 - 5.6 % 5.7      Exercise Target Goals: Exercise Program Goal: Individual exercise prescription set using results from initial 6 min walk test and THRR while considering  patient's activity barriers and safety.   Exercise Prescription Goal: Initial exercise prescription builds to 30-45  minutes a day of aerobic activity, 2-3 days per week.  Home exercise guidelines will be given to patient during program as part of exercise prescription that the participant will acknowledge.   Education: Aerobic Exercise: - Group verbal and visual presentation on the components of exercise prescription. Introduces F.I.T.T principle from ACSM for exercise prescriptions.  Reviews F.I.T.T. principles of aerobic exercise including progression. Written material given at graduation. Flowsheet Row Cardiac Rehab from 01/16/2023 in [facility] Cardiac and Pulmonary Rehab  Education need identified 01/16/23       Education: Resistance Exercise: - Group verbal and  visual presentation on the components of exercise prescription. Introduces F.I.T.T principle from ACSM for exercise prescriptions  Reviews F.I.T.T. principles of resistance exercise including progression. Written material given at graduation.    Education: Exercise & Equipment Safety: - Individual verbal instruction and demonstration of equipment use and safety with use of the equipment. Flowsheet Row Cardiac Rehab from 01/16/2023 in [facility] Cardiac and Pulmonary Rehab  Date 01/16/23  Educator NT  Instruction Review Code 1- Verbalizes Understanding       Education: Exercise Physiology & General Exercise Guidelines: - Group verbal and written instruction with models to review the exercise physiology of the cardiovascular system and associated critical values. Provides general exercise guidelines with specific guidelines to those with heart or lung disease.    Education: Flexibility, Balance, Mind/Body Relaxation: - Group verbal and visual presentation with interactive activity on the components of exercise prescription. Introduces F.I.T.T principle from ACSM for exercise prescriptions. Reviews F.I.T.T. principles of flexibility and balance exercise training including progression. Also discusses the mind body connection.  Reviews various relaxation techniques to help reduce and manage stress (i.e. Deep breathing, progressive muscle relaxation, and visualization). Balance handout provided to take home. Written material given at graduation.   Activity Barriers & Risk Stratification:  Activity Barriers & Cardiac Risk Stratification - 01/16/23 1557       Activity Barriers & Cardiac Risk Stratification   Activity Barriers Joint Problems;Shortness of Breath;Neck/Spine Problems;Balance Concerns;History of Falls   L knee 30% disabled, R knee weak at times, neck hure in accident in past   Cardiac Risk Stratification High             6 Minute Walk:  6 Minute Walk     Row Name 01/16/23 1610          6 Minute Walk   Phase Initial     Distance 700 feet     Walk Time 6 minutes     # of Rest Breaks 0     MPH 1.33     METS 3.07     RPE 9     Perceived Dyspnea  2     VO2 Peak 10.74     Symptoms Yes (comment)     Comments knee pain 8/10, limp     Resting HR 98 bpm     Resting BP 122/82     Resting Oxygen Saturation  97 %     Exercise Oxygen Saturation  during 6 min walk 97 %     Max Ex. HR 114 bpm     Max Ex. BP 136/86     2 Minute Post BP 120/78              Oxygen Initial Assessment:   Oxygen Re-Evaluation:   Oxygen Discharge (Final Oxygen Re-Evaluation):   Initial Exercise Prescription:  Initial Exercise Prescription - 01/16/23 1600       Date of Initial Exercise  RX and Referring Provider   Date 01/16/23    Referring Provider Dr. Massie Maroon, MD      Oxygen   Maintain Oxygen Saturation 88% or higher      Treadmill   MPH 1.3    Grade 0    Minutes 15    METs 2      NuStep   Level 2    SPM 80    Minutes 15    METs 3.07      Biostep-RELP   Level 1    SPM 50    Minutes 15    METs 3.07      Track   Laps 15    Minutes 15    METs 1.82      Prescription Details   Frequency (times per week) 2    Duration Progress to 30 minutes of continuous aerobic without signs/symptoms of physical distress      Intensity   THRR 40-80% of Max Heartrate 126-155    Ratings of Perceived Exertion 11-15    Perceived Dyspnea 0-4      Progression   Progression Continue to progress workloads to maintain intensity without signs/symptoms of physical distress.      Resistance Training   Training Prescription Yes    Weight 4 lb    Reps 10-15             Perform Capillary Blood Glucose checks as needed.  Exercise Prescription Changes:   Exercise Prescription Changes     Row Name 01/16/23 1600             Response to Exercise   Blood Pressure (Admit) 122/82       Blood Pressure (Exercise) 136/86       Blood Pressure (Exit) 120/78       Heart  Rate (Admit) 98 bpm       Heart Rate (Exercise) 114 bpm       Heart Rate (Exit) 97 bpm       Oxygen Saturation (Admit) 97 %       Oxygen Saturation (Exercise) 97 %       Rating of Perceived Exertion (Exercise) 9       Perceived Dyspnea (Exercise) 2       Symptoms knee pain 8/10, limp       Comments Results                Exercise Comments:   Exercise Goals and Review:   Exercise Goals     Row Name 01/16/23 1558             Exercise Goals   Increase Physical Activity Yes       Intervention Provide advice, education, support and counseling about physical activity/exercise needs.;Develop an individualized exercise prescription for aerobic and resistive training based on initial evaluation findings, risk stratification, comorbidities and participant's personal goals.       Expected Outcomes Short Term: Attend rehab on a regular basis to increase amount of physical activity.;Long Term: Add in home exercise to make exercise part of routine and to increase amount of physical activity.;Long Term: Exercising regularly at least 3-5 days a week.       Increase Strength and Stamina Yes       Intervention Provide advice, education, support and counseling about physical activity/exercise needs.;Develop an individualized exercise prescription for aerobic and resistive training based on initial evaluation findings, risk stratification, comorbidities and participant's personal goals.  Expected Outcomes Short Term: Perform resistance training exercises routinely during rehab and add in resistance training at home;Long Term: Improve cardiorespiratory fitness, muscular endurance and strength as measured by increased METs and functional capacity ( );Short Term: Increase workloads from initial exercise prescription for resistance, speed, and METs.       Able to understand and use rate of perceived exertion (RPE) scale Yes       Intervention Provide education and explanation on how to use  RPE scale       Expected Outcomes Short Term: Able to use RPE daily in rehab to express subjective intensity level;Long Term:  Able to use RPE to guide intensity level when exercising independently       Able to understand and use Dyspnea scale Yes       Intervention Provide education and explanation on how to use Dyspnea scale       Expected Outcomes Short Term: Able to use Dyspnea scale daily in rehab to express subjective sense of shortness of breath during exertion;Long Term: Able to use Dyspnea scale to guide intensity level when exercising independently       Knowledge and understanding of Target Heart Rate Range (THRR) Yes       Intervention Provide education and explanation of THRR including how the numbers were predicted and where they are located for reference       Expected Outcomes Short Term: Able to state/look up THRR;Long Term: Able to use THRR to govern intensity when exercising independently;Short Term: Able to use daily as guideline for intensity in rehab       Able to check pulse independently Yes       Intervention Provide education and demonstration on how to check pulse in carotid and radial arteries.;Review the importance of being able to check your own pulse for safety during independent exercise       Expected Outcomes Short Term: Able to explain why pulse checking is important during independent exercise;Long Term: Able to check pulse independently and accurately       Understanding of Exercise Prescription Yes       Intervention Provide education, explanation, and written materials on patient's individual exercise prescription       Expected Outcomes Short Term: Able to explain program exercise prescription;Long Term: Able to explain home exercise prescription to exercise independently                Exercise Goals Re-Evaluation :   Discharge Exercise Prescription (Final Exercise Prescription Changes):  Exercise Prescription Changes - 01/16/23 1600        Response to Exercise   Blood Pressure (Admit) 122/82    Blood Pressure (Exercise) 136/86    Blood Pressure (Exit) 120/78    Heart Rate (Admit) 98 bpm    Heart Rate (Exercise) 114 bpm    Heart Rate (Exit) 97 bpm    Oxygen Saturation (Admit) 97 %    Oxygen Saturation (Exercise) 97 %    Rating of Perceived Exertion (Exercise) 9    Perceived Dyspnea (Exercise) 2    Symptoms knee pain 8/10, limp    Comments Results             Nutrition:  Target Goals: Understanding of nutrition guidelines, daily intake of sodium 1500mg , cholesterol 200mg , calories 30% from fat and 7% or less from saturated fats, daily to have 5 or more servings of fruits and vegetables.  Education: All About Nutrition: -Group instruction provided by verbal, written material, interactive activities, discussions,  models, and posters to present general guidelines for heart healthy nutrition including fat, fiber, MyPlate, the role of sodium in heart healthy nutrition, utilization of the nutrition label, and utilization of this knowledge for meal planning. Follow up email sent as well. Written material given at graduation. Flowsheet Row Cardiac Rehab from 01/16/2023 in [facility] Cardiac and Pulmonary Rehab  Education need identified 01/16/23       Biometrics:  Pre Biometrics - 01/16/23 1613       Pre Biometrics   Height 5\' 11"  (1.803 m)    Weight 210 lb 4.8 oz (95.4 kg)    Waist Circumference 40 inches    Hip Circumference 42 inches    Waist to Hip Ratio 0.95 %    BMI (Calculated) 29.34    Single Leg Stand 10.5 seconds             Post Biometrics - 01/16/23 1559        Post  Biometrics   Height --    Weight --    Waist Circumference --    Hip Circumference --    Waist to Hip Ratio --    BMI (Calculated) --    Single Leg Stand --             Nutrition Therapy Plan and Nutrition Goals:   Nutrition Assessments:  MEDIFICTS Score Key: >=70 Need to make dietary changes  40-70 Heart Healthy  Diet <= 40 Therapeutic Level Cholesterol Diet  Flowsheet Row Cardiac Rehab from 01/12/2023 in [facility] Cardiac and Pulmonary Rehab  Picture Your Plate Total Score on Admission 59      Picture Your Plate Scores: <21 Unhealthy dietary pattern with much room for improvement. 41-50 Dietary pattern unlikely to meet recommendations for good health and room for improvement. 51-60 More healthful dietary pattern, with some room for improvement.  >60 Healthy dietary pattern, although there may be some specific behaviors that could be improved.    Nutrition Goals Re-Evaluation:   Nutrition Goals Discharge (Final Nutrition Goals Re-Evaluation):   Psychosocial: Target Goals: Acknowledge presence or absence of significant depression and/or stress, maximize coping skills, provide positive support system. Participant is able to verbalize types and ability to use techniques and skills needed for reducing stress and depression.   Education: Stress, Anxiety, and Depression - Group verbal and visual presentation to define topics covered.  Reviews how body is impacted by stress, anxiety, and depression.  Also discusses healthy ways to reduce stress and to treat/manage anxiety and depression.  Written material given at graduation.   Education: Sleep Hygiene -Provides group verbal and written instruction about how sleep can affect your health.  Define sleep hygiene, discuss sleep cycles and impact of sleep habits. Review good sleep hygiene tips.    Initial Review & Psychosocial Screening:  Initial Psych Review & Screening - 01/12/23 1050       Initial Review   Current issues with Current Anxiety/Panic;Current Stress Concerns    Source of Stress Concerns Financial    Comments out of work  a year working on disability.   prescibed meds for anxiety.  is working on trting to redirect thoughts to decrease anxiety      Family Dynamics   Good Support System? Yes   oldest kid, Mom, some friends     Barriers    Psychosocial barriers to participate in program There are no identifiable barriers or psychosocial needs.      Screening Interventions   Interventions Encouraged to exercise  Expected Outcomes Short Term goal: Utilizing psychosocial counselor, staff and physician to assist with identification of specific Stressors or current issues interfering with healing process. Setting desired goal for each stressor or current issue identified.;Long Term Goal: Stressors or current issues are controlled or eliminated.;Short Term goal: Identification and review with participant of any Quality of Life or Depression concerns found by scoring the questionnaire.;Long Term goal: The participant improves quality of Life and PHQ9 Scores as seen by post scores and/or verbalization of changes             Quality of Life Scores:   Quality of Life - 01/12/23 1115       Quality of Life   Select Quality of Life      Quality of Life Scores   Health/Function Pre 12.94 %    Socioeconomic Pre 11.13 %    Psych/Spiritual Pre 13.75 %    Family Pre 13.64 %    GLOBAL Pre 16.1 %            Scores of 19 and below usually indicate a poorer quality of life in these areas.  A difference of  2-3 points is a clinically meaningful difference.  A difference of 2-3 points in the total score of the Quality of Life Index has been associated with significant improvement in overall quality of life, self-image, physical symptoms, and general health in studies assessing change in quality of life.  PHQ-9: Review Flowsheet       01/16/2023  Depression screen PHQ 2/9  Decreased Interest 3  Down, Depressed, Hopeless 2  PHQ - 2 Score 5  Altered sleeping 3  Tired, decreased energy 2  Change in appetite 2  Feeling bad or failure about yourself  1  Trouble concentrating 2  Moving slowly or fidgety/restless 0  Suicidal thoughts 0  PHQ-9 Score 15  Difficult doing work/chores Somewhat difficult   Interpretation of Total  Score  Total Score Depression Severity:  1-4 = Minimal depression, 5-9 = Mild depression, 10-14 = Moderate depression, 15-19 = Moderately severe depression, 20-27 = Severe depression   Psychosocial Evaluation and Intervention:  Psychosocial Evaluation - 01/12/23 1109       Psychosocial Evaluation & Interventions   Interventions Encouraged to exercise with the program and follow exercise prescription;Relaxation education    Comments There are no barriers to attending the program. His Mom lives with him. She and his older son and smoe friends are his support.  He does have a history of anxiety and has meds to help as needed. He stated that he tries to think about other things to decrease the anxiety. He does have finacial concern as he has been out of work about a year. He is working with a Clinical research associate to get diasability.  He is ready to start the program and work on feeling better    Expected Outcomes STG attend all sessions as scheduled, continue to work on methods to decrease anxiety response.  LTG able to manage anxiety symptoms    Continue Psychosocial Services  Follow up required by staff             Psychosocial Re-Evaluation:   Psychosocial Discharge (Final Psychosocial Re-Evaluation):   Vocational Rehabilitation: Provide vocational rehab assistance to qualifying candidates.   Vocational Rehab Evaluation & Intervention:  Vocational Rehab - 01/12/23 1113       Initial Vocational Rehab Evaluation & Intervention   Assessment shows need for Vocational Rehabilitation No      Vocational Rehab  Re-Evaulation   Comments applying for disability             Education: Education Goals: Education classes will be provided on a variety of topics geared toward better understanding of heart health and risk factor modification. Participant will state understanding/return demonstration of topics presented as noted by education test scores.  Learning Barriers/Preferences:   General  Cardiac Education Topics:  AED/CPR: - Group verbal and written instruction with the use of models to demonstrate the basic use of the AED with the basic ABC's of resuscitation.   Anatomy and Cardiac Procedures: - Group verbal and visual presentation and models provide information about basic cardiac anatomy and function. Reviews the testing methods done to diagnose heart disease and the outcomes of the test results. Describes the treatment choices: Medical Management, Angioplasty, or Coronary Bypass Surgery for treating various heart conditions including Myocardial Infarction, Angina, Valve Disease, and Cardiac Arrhythmias.  Written material given at graduation.   Medication Safety: - Group verbal and visual instruction to review commonly prescribed medications for heart and lung disease. Reviews the medication, class of the drug, and side effects. Includes the steps to properly store meds and maintain the prescription regimen.  Written material given at graduation.   Intimacy: - Group verbal instruction through game format to discuss how heart and lung disease can affect sexual intimacy. Written material given at graduation..   Know Your Numbers and Heart Failure: - Group verbal and visual instruction to discuss disease risk factors for cardiac and pulmonary disease and treatment options.  Reviews associated critical values for Overweight/Obesity, Hypertension, Cholesterol, and Diabetes.  Discusses basics of heart failure: signs/symptoms and treatments.  Introduces Heart Failure Zone chart for action plan for heart failure.  Written material given at graduation. Flowsheet Row Cardiac Rehab from 01/16/2023 in [facility] Cardiac and Pulmonary Rehab  Education need identified 01/16/23       Infection Prevention: - Provides verbal and written material to individual with discussion of infection control including proper hand washing and proper equipment cleaning during exercise session. Flowsheet  Row Cardiac Rehab from 01/16/2023 in [facility] Cardiac and Pulmonary Rehab  Date 01/16/23  Educator NT  Instruction Review Code 1- Verbalizes Understanding       Falls Prevention: - Provides verbal and written material to individual with discussion of falls prevention and safety. Flowsheet Row Cardiac Rehab from 01/16/2023 in [facility] Cardiac and Pulmonary Rehab  Date 01/12/23  Educator SB  Instruction Review Code 1- Verbalizes Understanding       Other: -Provides group and verbal instruction on various topics (see comments)   Knowledge Questionnaire Score:  Knowledge Questionnaire Score - 01/12/23 1117       Knowledge Questionnaire Score   Pre Score 23/26  missed NTG, nutrition and exercise             Core Components/Risk Factors/Patient Goals at Admission:  Personal Goals and Risk Factors at Admission - 01/12/23 1138       Core Components/Risk Factors/Patient Goals on Admission   Number of packs per day [name] is a former tobacco user. Intervention for tobacco cessation was provided at the initial medical review. He was asked about when he  quit and reported 11/14/2022 right before his surgery . Patient was advised and educated about tobacco cessation using combination therapy, tobacco cessation classes, quit line, and quit smoking apps. Patient demonstrated understanding of this material. Staff will continue to provide encouragement and follow up with the patient throughout the progra  Education:Diabetes - Individual verbal and written instruction to review signs/symptoms of diabetes, desired ranges of glucose level fasting, after meals and with exercise. Acknowledge that pre and post exercise glucose checks will be done for 3 sessions at entry of program.   Core Components/Risk Factors/Patient Goals Review:    Core Components/Risk Factors/Patient Goals at Discharge (Final Review):    ITP Comments:  ITP Comments     Row Name 01/12/23 1102 01/16/23  1551 01/18/23 0942       ITP Comments Virtual orientation call completed today. he has an appointment on Date: 01/16/2023  for EP eval and gym Orientation.  Documentation of diagnosis can be found in [facility] Date: 10/30/204 . Completed and gym orientation. Initial ITP created and sent for review to Dr. Bethann Punches, Medical Director. 30 Day review completed. Medical Director ITP review done, changes made as directed, and signed approval by Medical Director.   new to program              Comments:

## 2023-01-18 NOTE — Progress Notes (Signed)
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 1 can of soda per day. He has been gaining weight since he has become less physically active. He is trying to lose some weight with diet. He reports he rarely eats breakfast or lunch and will usually eat dinner. Spoke with him about the importance of spreading his calories and protein intake with fiber and healthy fats throughout the day. Encouraged him to log on myfitnesspal for a free resource to assess if he is under eating. Reviewed Mediterranean diet handout, educated on types of fats, sources, and how to read labels. Spoke about cutting back on sodium, he has already stopped using salt for cooking and seasoning. Reminded him to read labels as well, since much of sodium intake can come from processed and packaged foods. Built out several meals and snacks with foods he likes and will eat focusing on nutrient dense foods that will help him meet his protein, fiber, and calorie goals while keeping quantity of food low.      Goal 1: Eat 15-30gProtein and 30-60gCarbs at each meal. Goal 2: Read labels and reduce sodium intake to below 2300mg . Ideally 1500mg  per day.  Goal 3: Reduce saturated fat, less than 12g per day. Replace bad fats for more heart healthy fats.   End time 10:52 AM

## 2023-01-18 NOTE — Progress Notes (Signed)
Daily Session Note  Patient Details  Name: Corey Barnes MRN: [id number] Date of Birth: [birth year] Referring Provider:   Flowsheet Row Cardiac Rehab from 01/16/2023 in [facility] Cardiac and Pulmonary Rehab  Referring Provider Dr. Massie Maroon, MD       Encounter Date: 01/18/2023  Check In:  Session Check In - 01/18/23 1133       Check-In   Supervising physician immediately available to respond to emergencies See telemetry face sheet for immediately available ER MD    Location [facility]-Cardiac & Pulmonary Rehab    Staff Present Elige Ko, RCP,RRT,BSRT;Krista Karleen Hampshire RN, BSN;Maxon Conetta BS, , Exercise Physiologist;Eleesha Purkey Katrinka Blazing, RN, ADN    Virtual Visit No    Medication changes reported     No    Fall or balance concerns reported    No    Warm-up and Cool-down Performed on first and last piece of equipment    Resistance Training Performed Yes    VAD Patient? No    PAD/SET Patient? No      Pain Assessment   Currently in Pain? No/denies                Social History   Tobacco Use  Smoking Status Former   Current packs/day: 0.50   Average packs/day: 0.5 packs/day for 32.0 years (16.0 ttl pk-yrs)   Types: Cigarettes  Smokeless Tobacco Never    Goals Met:  Independence with exercise equipment Exercise tolerated well No report of concerns or symptoms today Strength training completed today  Goals Unmet:  Not Applicable  Comments: First full day of exercise!  Patient was oriented to gym and equipment including functions, settings, policies, and procedures.  Patient's individual exercise prescription and treatment plan were reviewed.  All starting workloads were established based on the results of the 6 minute walk test done at initial orientation visit.  The plan for exercise progression was also introduced and progression will be customized based on patient's performance and goals.    Dr. Bethann Punches is Medical Director for [facility] Cardiac Rehabilitation.  Dr.  Vida Rigger is Medical Director for [facility] Pulmonary Rehabilitation.

## 2023-01-23 ENCOUNTER — Encounter: Payer: Medicaid Other | Admitting: *Deleted

## 2023-01-23 DIAGNOSIS — Z48812 Encounter for surgical aftercare following surgery on the circulatory system: Secondary | ICD-10-CM | POA: Diagnosis not present

## 2023-01-23 DIAGNOSIS — Z951 Presence of aortocoronary bypass graft: Secondary | ICD-10-CM

## 2023-01-23 NOTE — Progress Notes (Signed)
Daily Session Note  Patient Details  Name: Corey Barnes MRN: [id number] Date of Birth: 21-Sep-1970 Referring Provider:   Flowsheet Row Cardiac Rehab from 01/16/2023 in [facility] Cardiac and Pulmonary Rehab  Referring Provider Dr. Massie Maroon, MD       Encounter Date: 01/23/2023  Check In:  Session Check In - 01/23/23 1340       Check-In   Supervising physician immediately available to respond to emergencies See telemetry face sheet for immediately available ER MD    Location [facility]-Cardiac & Pulmonary Rehab    Staff Present Cora Collum, RN, BSN, CCRP;Margaret Best, MS, Exercise Physiologist;Kristen Coble, RN,BC,MSN;Megan Katrinka Blazing, RN, ADN    Virtual Visit No    Medication changes reported     No    Fall or balance concerns reported    No    Warm-up and Cool-down Performed on first and last piece of equipment    Resistance Training Performed Yes    VAD Patient? No    PAD/SET Patient? No      Pain Assessment   Currently in Pain? No/denies                Social History   Tobacco Use  Smoking Status Former   Current packs/day: 0.50   Average packs/day: 0.5 packs/day for 32.0 years (16.0 ttl pk-yrs)   Types: Cigarettes  Smokeless Tobacco Never    Goals Met:  Independence with exercise equipment Exercise tolerated well No report of concerns or symptoms today  Goals Unmet:  Not Applicable  Comments: Pt able to follow exercise prescription today without complaint.  Will continue to monitor for progression.    Dr. Bethann Punches is Medical Director for [facility] Cardiac Rehabilitation.  Dr. Vida Rigger is Medical Director for [facility] Pulmonary Rehabilitation.

## 2023-01-26 ENCOUNTER — Emergency Department: Payer: Medicaid Other

## 2023-01-26 ENCOUNTER — Encounter: Payer: Self-pay | Admitting: Emergency Medicine

## 2023-01-26 ENCOUNTER — Other Ambulatory Visit: Payer: Self-pay

## 2023-01-26 ENCOUNTER — Emergency Department
Admission: EM | Admit: 2023-01-26 | Discharge: 2023-01-26 | Disposition: A | Discharge status: Home / Self Care | Payer: Medicaid Other | Attending: Emergency Medicine | Admitting: Emergency Medicine

## 2023-01-26 DIAGNOSIS — I2510 Atherosclerotic heart disease of native coronary artery without angina pectoris: Secondary | ICD-10-CM | POA: Insufficient documentation

## 2023-01-26 DIAGNOSIS — I10 Essential (primary) hypertension: Secondary | ICD-10-CM | POA: Diagnosis not present

## 2023-01-26 DIAGNOSIS — R079 Chest pain, unspecified: Secondary | ICD-10-CM | POA: Diagnosis present

## 2023-01-26 DIAGNOSIS — Z951 Presence of aortocoronary bypass graft: Secondary | ICD-10-CM | POA: Insufficient documentation

## 2023-01-26 DIAGNOSIS — Z87891 Personal history of nicotine dependence: Secondary | ICD-10-CM | POA: Insufficient documentation

## 2023-01-26 DIAGNOSIS — R0789 Other chest pain: Secondary | ICD-10-CM | POA: Diagnosis not present

## 2023-01-26 LAB — BASIC METABOLIC PANEL
Anion gap: 12 (ref 5–15)
BUN: 10 mg/dL (ref 6–20)
CO2: 21 mmol/L — ABNORMAL LOW (ref 22–32)
Calcium: 9.1 mg/dL (ref 8.9–10.3)
Chloride: 100 mmol/L (ref 98–111)
Creatinine, Ser: 0.84 mg/dL (ref 0.61–1.24)
GFR, Estimated: >60 mL/min (ref >60)
Glucose, Bld: 112 mg/dL — ABNORMAL HIGH (ref 70–99)
Potassium: 3.7 mmol/L (ref 3.5–5.1)
Sodium: 133 mmol/L — ABNORMAL LOW (ref 135–145)

## 2023-01-26 LAB — CBC
HCT: 47 % (ref 39.0–52.0)
Hemoglobin: 15.8 g/dL (ref 13.0–17.0)
MCH: 28.9 pg (ref 26.0–34.0)
MCHC: 33.6 g/dL (ref 30.0–36.0)
MCV: 85.9 fL (ref 80.0–100.0)
Platelets: 383 10*3/uL (ref 150–400)
RBC: 5.47 MIL/uL (ref 4.22–5.81)
RDW: 12.6 % (ref 11.5–15.5)
WBC: 10.5 10*3/uL (ref 4.0–10.5)
nRBC: 0 % (ref 0.0–0.2)

## 2023-01-26 LAB — TROPONIN I (HIGH SENSITIVITY): Troponin I (High Sensitivity): 4 ng/L (ref <18)

## 2023-01-26 NOTE — ED Provider Notes (Signed)
[facility] Provider Note    Event Date/Time   First MD Initiated Contact with Patient 01/26/23 [phone]     (approximate)   History   Hypertension and Chest Pain   HPI Corey Barnes is a 52 y.o. male whose medical history includes hypertension, tobacco dependence, and coronary artery disease status post CABG about 2 months ago at [facility].  He is doing cardiac rehab at [facility] and is followed by a cardiologist in [place].  He presents tonight for evaluation of high blood pressure and some chest discomfort.  He states that he has noticed that his blood pressure has been elevated for about 2 days, typically no higher than a systolic blood pressure of the 140s.  Similarly, about 2 days ago he started to have some intermittent sharp left-sided chest pain.  It is minimal and does not happen predictably and is very brief but it made him worried.  He said that ever since his CABG he has been short of breath and he has no short of breath than usual.  He still gets lightheaded if he gets up and moves too quickly.  He has had some nausea but no vomiting.  No recent fever.  He states he has been compliant with his medication regimen.  Nothing in particular seems to make the symptoms better or worse.  He has not yet been able to touch base with his cardiologist because of the Christmas holiday.     Physical Exam   Triage Vital Signs: ED Triage Vitals [01/26/23 0556]  Encounter Vitals Group     BP (!) 153/113     Systolic BP Percentile      Diastolic BP Percentile      Pulse Rate (!) 108     Resp 17     Temp 97.8 F (36.6 C)     Temp Source Oral     SpO2 100 %     Weight 95.3 kg (210 lb)     Height 1.803 m (5\' 11" )     Head Circumference      Peak Flow      Pain Score 0     Pain Loc      Pain Education      Exclude from Growth Chart     Most recent vital signs: Vitals:   01/26/23 0556 01/26/23 0630  BP: (!) 153/113 124/87  Pulse: (!) 108 94  Resp: 17 (!) 21   Temp: 97.8 F (36.6 C)   SpO2: 100% 99%    General: Awake, no distress.  Generally well-appearing despite his medical history and symptoms. CV:  Good peripheral perfusion.  Normal heart sounds, regular rate and rhythm. Resp:  Normal effort. Speaking easily and comfortably, no accessory muscle usage nor intercostal retractions.  Lungs are clear to auscultation. Abd:  No distention.  No tenderness to palpation.   ED Results / Procedures / Treatments   Labs (all labs ordered are listed, but only abnormal results are displayed) Labs Reviewed  BASIC METABOLIC PANEL - Abnormal; Notable for the following components:      Result Value   Sodium 133 (*)    CO2 21 (*)    Glucose, Bld 112 (*)    All other components within normal limits  CBC  TROPONIN I (HIGH SENSITIVITY)     EKG  ED ECG REPORT I, Loleta Rose, the attending physician, personally viewed and interpreted this ECG.  Date: 01/26/2023 EKG Time: 5:57 AM Rate: Borderline sinus  tachycardia Rhythm: normal sinus rhythm QRS Axis: normal Intervals: normal ST/T Wave abnormalities: Non-specific ST segment / T-wave changes, but no clear evidence of acute ischemia. Narrative Interpretation: no definitive evidence of acute ischemia; does not meet STEMI criteria.    RADIOLOGY I viewed and interpreted the patient's chest x-ray and I see no evidence of pneumonia or pneumothorax.  He has sequela of his recent CABG which was commented on by the radiologist, but there is no evidence of any acute or emergent findings.   PROCEDURES:  Critical Care performed: No  .1-3 Lead EKG Interpretation  Performed by: Loleta Rose, MD Authorized by: Loleta Rose, MD     Interpretation: normal     ECG rate:  90   ECG rate assessment: normal     Rhythm: sinus rhythm     Ectopy: none     Conduction: normal       IMPRESSION / MDM / ASSESSMENT AND PLAN / ED COURSE  I reviewed the triage vital signs and the nursing notes.                               Differential diagnosis includes, but is not limited to, angina, ACS including unstable angina, postoperative infection, pneumonia, pneumothorax, ACS, PE, musculoskeletal strain.  Patient's presentation is most consistent with acute presentation with potential threat to life or bodily function.  Labs/studies ordered: EKG, two-view chest x-ray, CBC, high-sensitivity troponin, basic metabolic panel.  Interventions/Medications given:  Medications - No data to display  (Note:  hospital course my include additional interventions and/or labs/studies not listed above.)   Vital signs are essentially normal.  He was hypertensive upon arrival but that settled down without intervention and he has had multiple blood pressure measurements that are reliable and in the range of 124/87.  I suspect the patient is understandably anxious given his recent surgery and known heart disease, but I think it is unlikely he is experiencing unstable angina.  EKG is reassuring as is chest x-ray.  No indication to repeat high-sensitivity troponin if the first 1 is negative given that he has been having intermittent symptoms for 2 days.  He seemed to feel better when I told him that the EKG was looking good.  I explained that if his labs are reassuring and he continues to be essentially asymptomatic while he is right now, he is appropriate for discharge and outpatient follow-up with his cardiologist.  He agrees with this plan.  The patient is on the cardiac monitor to evaluate for evidence of arrhythmia and/or significant heart rate changes.   Clinical Course as of 01/26/23 0709  Thu Jan 26, 2023  0707 Negative troponin and normal BNP.  Patient's blood pressure has come down even more.  He is very appropriate for discharge and outpatient follow-up and he is comfortable with that plan.  I gave my usual and customary follow-up recommendations and return precautions. [CF]    Clinical Course User Index [CF]  Loleta Rose, MD     FINAL CLINICAL IMPRESSION(S) / ED DIAGNOSES   Final diagnoses:  Primary hypertension  Atypical chest pain     Rx / DC Orders   ED Discharge Orders     None        Note:  This document was prepared using Dragon voice recognition software and may include unintentional dictation errors.   Loleta Rose, MD 01/26/23 [phone]

## 2023-01-26 NOTE — Discharge Instructions (Signed)
Your blood pressure was actually quite appropriate tonight and your evaluation was reassuring.  Please continue taking all of your prescribed medications and follow-up with your cardiologist at the next available opportunity.  Return to the emergency department if you develop new or worsening symptoms that concern you.

## 2023-01-26 NOTE — ED Triage Notes (Signed)
Pt arrives via POV ambulatory to triage for concern for elevated BP x 2 days. At home SBP 140s. Pt also reports concern for intermittent left chest pain approx 2 day. CP described as sharp, associated with SOB, lightheadedness, and nausea. Denies CP in triage.

## 2023-01-30 ENCOUNTER — Encounter: Payer: Medicaid Other | Admitting: *Deleted

## 2023-01-30 DIAGNOSIS — Z951 Presence of aortocoronary bypass graft: Secondary | ICD-10-CM

## 2023-01-30 DIAGNOSIS — Z48812 Encounter for surgical aftercare following surgery on the circulatory system: Secondary | ICD-10-CM | POA: Diagnosis not present

## 2023-01-30 NOTE — Progress Notes (Signed)
Daily Session Note  Patient Details  Name: Corey Barnes MRN: [id number] Date of Birth: 30-Jun-1970 Referring Provider:   Flowsheet Row Cardiac Rehab from 01/16/2023 in [facility] Cardiac and Pulmonary Rehab  Referring Provider Dr. Massie Maroon, MD       Encounter Date: 01/30/2023  Check In:  Session Check In - 01/30/23 1127       Check-In   Supervising physician immediately available to respond to emergencies See telemetry face sheet for immediately available ER MD    Location [facility]-Cardiac & Pulmonary Rehab    Staff Present Ronette Deter, BS, Exercise Physiologist;Susanne Bice, RN, BSN, CCRP;Meredith Jewel Baize, RN Atilano Median, RN, ADN    Virtual Visit No    Medication changes reported     No    Fall or balance concerns reported    No    Warm-up and Cool-down Performed on first and last piece of equipment    Resistance Training Performed Yes    VAD Patient? No    PAD/SET Patient? No      Pain Assessment   Currently in Pain? No/denies                Social History   Tobacco Use  Smoking Status Former   Current packs/day: 0.50   Average packs/day: 0.5 packs/day for 32.0 years (16.0 ttl pk-yrs)   Types: Cigarettes  Smokeless Tobacco Never    Goals Met:  Independence with exercise equipment Exercise tolerated well No report of concerns or symptoms today Strength training completed today  Goals Unmet:  Not Applicable  Comments: Pt able to follow exercise prescription today without complaint.  Will continue to monitor for progression.    Dr. Bethann Punches is Medical Director for [facility] Cardiac Rehabilitation.  Dr. Vida Rigger is Medical Director for [facility] Pulmonary Rehabilitation.

## 2023-02-06 ENCOUNTER — Encounter: Payer: Medicaid Other | Attending: Cardiology | Admitting: *Deleted

## 2023-02-06 DIAGNOSIS — Z951 Presence of aortocoronary bypass graft: Secondary | ICD-10-CM | POA: Insufficient documentation

## 2023-02-06 DIAGNOSIS — Z48812 Encounter for surgical aftercare following surgery on the circulatory system: Secondary | ICD-10-CM | POA: Diagnosis present

## 2023-02-06 NOTE — Progress Notes (Signed)
 Daily Session Note  Patient Details  Name: Corey Barnes MRN: [id number] Date of Birth: 24-Dec-1970 Referring Provider:   Flowsheet Row Cardiac Rehab from 01/16/2023 in [facility] Cardiac and Pulmonary Rehab  Referring Provider Dr. Margery Ruth, MD       Encounter Date: 02/06/2023  Check In:  Session Check In - 02/06/23 1149       Check-In   Supervising physician immediately available to respond to emergencies See telemetry face sheet for immediately available ER MD    Location [facility]-Cardiac & Pulmonary Rehab    Staff Present Rollene Paterson, MS, Exercise Physiologist;Maxon Burnell HECKLE, , Exercise Physiologist;Kelly Dyane, BS, ACSM CEP, Exercise Physiologist;Aamir Mclinden Claudene, RN, ADN    Virtual Visit No    Medication changes reported     No    Fall or balance concerns reported    No    Warm-up and Cool-down Performed on first and last piece of equipment    Resistance Training Performed Yes    VAD Patient? No    PAD/SET Patient? No      Pain Assessment   Currently in Pain? No/denies                Social History   Tobacco Use  Smoking Status Former   Current packs/day: 0.50   Average packs/day: 0.5 packs/day for 32.0 years (16.0 ttl pk-yrs)   Types: Cigarettes  Smokeless Tobacco Never    Goals Met:  Independence with exercise equipment Exercise tolerated well No report of concerns or symptoms today Strength training completed today  Goals Unmet:  Not Applicable  Comments: Pt able to follow exercise prescription today without complaint.  Will continue to monitor for progression.    Dr. Oneil Pinal is Medical Director for [facility] Cardiac Rehabilitation.  Dr. Fuad Aleskerov is Medical Director for [facility] Pulmonary Rehabilitation.

## 2023-02-08 ENCOUNTER — Encounter: Payer: Medicaid Other | Admitting: *Deleted

## 2023-02-08 DIAGNOSIS — Z48812 Encounter for surgical aftercare following surgery on the circulatory system: Secondary | ICD-10-CM | POA: Diagnosis not present

## 2023-02-08 DIAGNOSIS — Z951 Presence of aortocoronary bypass graft: Secondary | ICD-10-CM

## 2023-02-08 NOTE — Progress Notes (Signed)
 Daily Session Note  Patient Details  Name: Corey Barnes MRN: [id number] Date of Birth: [birth year] Referring Provider:   Flowsheet Row Cardiac Rehab from 01/16/2023 in [facility] Cardiac and Pulmonary Rehab  Referring Provider Dr. Margery Ruth, MD       Encounter Date: 02/08/2023  Check In:  Session Check In - 02/08/23 1110       Check-In   Supervising physician immediately available to respond to emergencies See telemetry face sheet for immediately available ER MD    Location [facility]-Cardiac & Pulmonary Rehab    Staff Present Othel Durand, RN, BSN, CCRP;Joseph [place], RCP,RRT,BSRT;Kelly Kinta, BS, ACSM CEP, Exercise Physiologist;Kobi Aller Claudene, RN, ADN    Virtual Visit No    Medication changes reported     No    Fall or balance concerns reported    No    Warm-up and Cool-down Performed on first and last piece of equipment    Resistance Training Performed Yes    VAD Patient? No    PAD/SET Patient? No      Pain Assessment   Currently in Pain? No/denies                Social History   Tobacco Use  Smoking Status Former   Current packs/day: 0.50   Average packs/day: 0.5 packs/day for 32.0 years (16.0 ttl pk-yrs)   Types: Cigarettes  Smokeless Tobacco Never    Goals Met:  Independence with exercise equipment Exercise tolerated well No report of concerns or symptoms today Strength training completed today  Goals Unmet:  Not Applicable  Comments: Pt able to follow exercise prescription today without complaint.  Will continue to monitor for progression.    Dr. Oneil Pinal is Medical Director for [facility] Cardiac Rehabilitation.  Dr. Fuad Aleskerov is Medical Director for [facility] Pulmonary Rehabilitation.

## 2023-02-13 ENCOUNTER — Ambulatory Visit: Payer: Medicaid Other

## 2023-02-15 ENCOUNTER — Encounter: Payer: Medicaid Other | Admitting: *Deleted

## 2023-02-15 ENCOUNTER — Encounter: Payer: Self-pay | Admitting: *Deleted

## 2023-02-15 DIAGNOSIS — Z951 Presence of aortocoronary bypass graft: Secondary | ICD-10-CM

## 2023-02-15 DIAGNOSIS — Z48812 Encounter for surgical aftercare following surgery on the circulatory system: Secondary | ICD-10-CM | POA: Diagnosis not present

## 2023-02-15 NOTE — Progress Notes (Signed)
 Daily Session Note  Patient Details  Name: Corey Barnes MRN: [id number] Date of Birth: 13-Dec-1970 Referring Provider:   Flowsheet Row Cardiac Rehab from 01/16/2023 in [facility] Cardiac and Pulmonary Rehab  Referring Provider Dr. Teddie Favre, MD       Encounter Date: 02/15/2023  Check In:  Session Check In - 02/15/23 1126       Check-In   Supervising physician immediately available to respond to emergencies See telemetry face sheet for immediately available ER MD    Location [facility]-Cardiac & Pulmonary Rehab    Staff Present Maud Sorenson, RN, BSN, CCRP;Meredith Manson Seitz RN,BSN;Joseph Hood RCP,RRT,BSRT;Maxon McLeod BS, Exercise Physiologist    Virtual Visit No    Medication changes reported     No    Fall or balance concerns reported    No    Warm-up and Cool-down Performed on first and last piece of equipment    Resistance Training Performed Yes    VAD Patient? No    PAD/SET Patient? No      Pain Assessment   Currently in Pain? No/denies                Social History   Tobacco Use  Smoking Status Former   Current packs/day: 0.50   Average packs/day: 0.5 packs/day for 32.0 years (16.0 ttl pk-yrs)   Types: Cigarettes  Smokeless Tobacco Never    Goals Met:  Independence with exercise equipment Exercise tolerated well No report of concerns or symptoms today  Goals Unmet:  Not Applicable  Comments: Pt able to follow exercise prescription today without complaint.  Will continue to monitor for progression.    Dr. Firman Hughes is Medical Director for [facility] Cardiac Rehabilitation.  Dr. Fuad Aleskerov is Medical Director for [facility] Pulmonary Rehabilitation.

## 2023-02-15 NOTE — Progress Notes (Signed)
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    Psych/Spiritual Pre 13.75 %    Family Pre 13.64 %    GLOBAL Pre 16.1 %            Scores of 19 and below usually indicate a poorer quality of life in these areas.  A difference of  2-3 points is a clinically meaningful difference.  A difference of 2-3 points in the total score of the Quality of Life Index has been associated with significant improvement in overall quality of life, self-image, physical symptoms, and general health in studies assessing change in quality of life.  PHQ-9: Review Flowsheet       02/06/2023 01/16/2023  Depression screen PHQ 2/9  Decreased Interest 2 3  Down, Depressed, Hopeless 1 2  PHQ - 2 Score 3 5  Altered sleeping 3 3  Tired, decreased energy 2 2  Change in appetite 2 2  Feeling bad or failure about yourself  3 1  Trouble concentrating 1 2  Moving slowly or fidgety/restless 0 0  Suicidal  thoughts 0 0  PHQ-9 Score 14 15  Difficult doing work/chores Somewhat difficult Somewhat difficult   Interpretation of Total Score  Total Score Depression Severity:  1-4 = Minimal depression, 5-9 = Mild depression, 10-14 = Moderate depression, 15-19 = Moderately severe depression, 20-27 = Severe depression   Psychosocial Evaluation and Intervention:  Psychosocial Evaluation - 01/12/23 1109       Psychosocial Evaluation & Interventions   Interventions Encouraged to exercise with the program and follow exercise prescription;Relaxation education    Comments There are no barriers to attending the program. His Mom lives with him. She and his older son and smoe friends are his support.  He does have a history of anxiety and has meds to help as needed. He stated that he tries to think about other things to decrease the anxiety. He does have finacial concern as he has been out of work about a year. He is working with a Clinical research associate to get diasability.  He is ready to start the program and work on feeling better    Expected Outcomes STG attend all sessions as scheduled, continue to work on methods to decrease anxiety response.  LTG able to manage anxiety symptoms    Continue Psychosocial Services  Follow up required by staff             Psychosocial Re-Evaluation:  Psychosocial Re-Evaluation     Row Name 02/06/23 1138             Psychosocial Re-Evaluation   Current issues with Current Anxiety/Panic;Current Stress Concerns       Comments Patient reevaluated with another PHQ questionaire. His score went down from a 15 to a 14. He struggles with the fact that most of his hobbies require being physical and having mobility, with which he is limiited. He also has financial stressors currently. Continues with prescribed treatment.       Expected Outcomes Short: continue to exercise to try to gain strength and mobility so he can start doing hobbies he previously enjoyed. Long: maintian good mental  health habits.       Interventions Encouraged to attend Cardiac Rehabilitation for the exercise       Continue Psychosocial Services  Follow up required by staff                Psychosocial Discharge (Final Psychosocial Re-Evaluation):  Psychosocial Re-Evaluation - 02/06/23 1138       Psychosocial Re-Evaluation   Current issues with Current Anxiety/Panic;Current Stress Concerns    Comments Patient reevaluated with another PHQ questionaire. His score went down from a 15 to a 14. He struggles with the fact that most of his hobbies require being physical and having mobility, with which he is limiited. He also has financial stressors currently. Continues with prescribed treatment.    Expected Outcomes Short: continue to exercise to try to gain strength and mobility so he can start doing hobbies he previously enjoyed. Long: maintian good mental health habits.    Interventions Encouraged to attend Cardiac Rehabilitation for the exercise    Continue Psychosocial Services  Follow up required by staff             Vocational Rehabilitation: Provide vocational rehab assistance  to qualifying candidates.   Vocational Rehab Evaluation & Intervention:  Vocational Rehab - 01/12/23 1113       Initial Vocational Rehab Evaluation & Intervention   Assessment shows need for Vocational Rehabilitation No      Vocational Rehab Re-Evaulation   Comments applying for disability             Education: Education Goals: Education classes will be provided on a variety of topics geared toward better understanding of heart health and risk factor modification. Participant will state understanding/return demonstration of topics presented as noted by education test scores.  Learning Barriers/Preferences:   General Cardiac Education Topics:  AED/CPR: - Group verbal and written instruction with the use of models to demonstrate the basic use of the AED with the basic ABC's of  resuscitation.   Anatomy and Cardiac Procedures: - Group verbal and visual presentation and models provide information about basic cardiac anatomy and function. Reviews the testing methods done to diagnose heart disease and the outcomes of the test results. Describes the treatment choices: Medical Management, Angioplasty, or Coronary Bypass Surgery for treating various heart conditions including Myocardial Infarction, Angina, Valve Disease, and Cardiac Arrhythmias.  Written material given at graduation.   Medication Safety: - Group verbal and visual instruction to review commonly prescribed medications for heart and lung disease. Reviews the medication, class of the drug, and side effects. Includes the steps to properly store meds and maintain the prescription regimen.  Written material given at graduation.   Intimacy: - Group verbal instruction through game format to discuss how heart and lung disease can affect sexual intimacy. Written material given at graduation..   Know Your Numbers and Heart Failure: - Group verbal and visual instruction to discuss disease risk factors for cardiac and pulmonary disease and treatment options.  Reviews associated critical values for Overweight/Obesity, Hypertension, Cholesterol, and Diabetes.  Discusses basics of heart failure: signs/symptoms and treatments.  Introduces Heart Failure Zone chart for action plan for heart failure.  Written material given at graduation. Flowsheet Row Cardiac Rehab from 01/16/2023 in [facility] Cardiac and Pulmonary Rehab  Education need identified 01/16/23       Infection Prevention: - Provides verbal and written material to individual with discussion of infection control including proper hand washing and proper equipment cleaning during exercise session. Flowsheet Row Cardiac Rehab from 01/16/2023 in [facility] Cardiac and Pulmonary Rehab  Date 01/16/23  Educator NT  Instruction Review Code 1- Verbalizes Understanding        Falls Prevention: - Provides verbal and written material to individual with discussion of falls prevention and safety. Flowsheet Row Cardiac Rehab from 01/16/2023 in [facility] Cardiac and Pulmonary Rehab  Date 01/12/23  Educator SB  Instruction Review Code 1- Verbalizes Understanding       Other: -Provides group and verbal instruction on various topics (see comments)   Knowledge Questionnaire Score:  Knowledge Questionnaire Score - 01/12/23 1117       Knowledge Questionnaire Score   Pre Score 23/26  missed NTG, nutrition and exercise             Core Components/Risk Factors/Patient Goals at Admission:  Personal Goals and Risk Factors at Admission - 01/12/23 1138       Core Components/Risk Factors/Patient Goals on Admission   Number of packs per day Corey Barnes is a former tobacco user. Intervention for tobacco cessation was provided at the initial medical review. He was asked about when he  quit and reported 11/14/2022 right before his surgery . Patient  was advised and educated about tobacco cessation using combination therapy, tobacco cessation classes, quit line, and quit smoking apps. Patient demonstrated understanding of this material. Staff will continue to provide encouragement and follow up with the patient throughout the progra             Education:Diabetes - Individual verbal and written instruction to review signs/symptoms of diabetes, desired ranges of glucose level fasting, after meals and with exercise. Acknowledge that pre and post exercise glucose checks will be done for 3 sessions at entry of program.   Core Components/Risk Factors/Patient Goals Review:   Goals and Risk Factor Review     Row Name 02/06/23 1128             Core Components/Risk Factors/Patient Goals Review   Personal Goals Review Tobacco Cessation;Hypertension       Review Corey Barnes reports that he is still tobacco free. He also stated that he takes all his BP meds and that he does have a  wrist blood pressure monitor at home that he uses to monitor is BP. He had an elevated reading a while ago and ended up in the ED and has a follow up with his cardiologist tomorrow from this ED vist. He was encouraged to bring in his wrist monitor to class to have us  check its accuacy.       Expected Outcomes Short: bring in home BP cuff to have it checked for accuacy. Long: continue to monitor BP at home and maintain tobacco free status.                Core Components/Risk Factors/Patient Goals at Discharge (Final Review):   Goals and Risk Factor Review - 02/06/23 1128       Core Components/Risk Factors/Patient Goals Review   Personal Goals Review Tobacco Cessation;Hypertension    Review Corey Barnes reports that he is still tobacco free. He also stated that he takes all his BP meds and that he does have a wrist blood pressure monitor at home that he uses to monitor is BP. He had an elevated reading a while ago and ended up in the ED and has a follow up with his cardiologist tomorrow from this ED vist. He was encouraged to bring in his wrist monitor to class to have us  check its accuacy.    Expected Outcomes Short: bring in home BP cuff to have it checked for accuacy. Long: continue to monitor BP at home and maintain tobacco free status.             ITP Comments:  ITP Comments     Row Name 01/12/23 1102 01/16/23 1551 01/18/23 0942 01/18/23 1134 02/15/23 1250   ITP Comments Virtual orientation call completed today. he has an appointment on Date: 01/16/2023  for EP eval and gym Orientation.  Documentation of diagnosis can be found in [facility] Date: 10/30/204 . Completed and gym orientation. Initial ITP created and sent for review to Dr. Firman Hughes, Medical Director. 30 Day review completed. Medical Director ITP review done, changes made as directed, and signed approval by Medical Director.   new to program First full day of exercise!  Patient was oriented to gym and equipment including  functions, settings, policies, and procedures.  Patient's individual exercise prescription and treatment plan were reviewed.  All starting workloads were established based on the results of the 6 minute walk test done at initial orientation visit.  The plan for exercise progression was also introduced and progression will be customized  based on patient's performance and goals. 30 Day review completed. Medical Director ITP review done, changes made as directed, and signed approval by Medical Director.    new to program            Comments:

## 2023-02-20 ENCOUNTER — Encounter: Payer: Medicaid Other | Admitting: *Deleted

## 2023-02-20 DIAGNOSIS — Z951 Presence of aortocoronary bypass graft: Secondary | ICD-10-CM

## 2023-02-20 DIAGNOSIS — Z48812 Encounter for surgical aftercare following surgery on the circulatory system: Secondary | ICD-10-CM | POA: Diagnosis not present

## 2023-02-20 NOTE — Progress Notes (Signed)
Daily Session Note  Patient Details  Name: Corey Barnes MRN: [id number] Date of Birth: [birth year] Referring Provider:   Flowsheet Row Cardiac Rehab from 01/16/2023 in [facility] Cardiac and Pulmonary Rehab  Referring Provider Dr. Massie Maroon, MD       Encounter Date: 02/20/2023  Check In:  Session Check In - 02/20/23 1121       Check-In   Supervising physician immediately available to respond to emergencies See telemetry face sheet for immediately available ER MD    Location [facility]-Cardiac & Pulmonary Rehab    Staff Present Rory Percy, MS, Exercise Physiologist;Maxon Suzzette Righter, Exercise Physiologist;Kelly Cloretta Ned, ACSM CEP, Exercise Physiologist;Julian Medina Jewel Baize RN,BSN    Virtual Visit No    Medication changes reported     No    Fall or balance concerns reported    No    Tobacco Cessation No Change    Warm-up and Cool-down Performed on first and last piece of equipment    Resistance Training Performed Yes    VAD Patient? No    PAD/SET Patient? No      Pain Assessment   Currently in Pain? No/denies                Social History   Tobacco Use  Smoking Status Former   Current packs/day: 0.50   Average packs/day: 0.5 packs/day for 32.0 years (16.0 ttl pk-yrs)   Types: Cigarettes  Smokeless Tobacco Never    Goals Met:  Independence with exercise equipment Exercise tolerated well No report of concerns or symptoms today Strength training completed today  Goals Unmet:  Not Applicable  Comments: Pt able to follow exercise prescription today without complaint.  Will continue to monitor for progression.    Dr. Bethann Punches is Medical Director for [facility] Cardiac Rehabilitation.  Dr. Vida Rigger is Medical Director for [facility] Pulmonary Rehabilitation.

## 2023-02-27 ENCOUNTER — Encounter: Payer: Medicaid Other | Admitting: *Deleted

## 2023-02-27 DIAGNOSIS — Z951 Presence of aortocoronary bypass graft: Secondary | ICD-10-CM

## 2023-02-27 DIAGNOSIS — Z48812 Encounter for surgical aftercare following surgery on the circulatory system: Secondary | ICD-10-CM | POA: Diagnosis not present

## 2023-02-27 NOTE — Progress Notes (Signed)
Daily Session Note  Patient Details  Name: Corey Barnes MRN: [id number] Date of Birth: 1970/08/12 Referring Provider:   Flowsheet Row Cardiac Rehab from 01/16/2023 in [facility] Cardiac and Pulmonary Rehab  Referring Provider Dr. Massie Maroon, MD       Encounter Date: 02/27/2023  Check In:  Session Check In - 02/27/23 1137       Check-In   Supervising physician immediately available to respond to emergencies See telemetry face sheet for immediately available ER MD    Location [facility]-Cardiac & Pulmonary Rehab    Staff Present Rory Percy, MS, Exercise Physiologist;Maxon Conetta BS, Exercise Physiologist;Dyami Umbach, RN, BSN, CCRP;Kelly Hayes BS, ACSM CEP, Exercise Physiologist    Virtual Visit No    Medication changes reported     No    Fall or balance concerns reported    No    Warm-up and Cool-down Performed on first and last piece of equipment    Resistance Training Performed Yes    VAD Patient? No    PAD/SET Patient? No      Pain Assessment   Currently in Pain? No/denies                Social History   Tobacco Use  Smoking Status Former   Current packs/day: 0.50   Average packs/day: 0.5 packs/day for 32.0 years (16.0 ttl pk-yrs)   Types: Cigarettes  Smokeless Tobacco Never    Goals Met:  Independence with exercise equipment Exercise tolerated well No report of concerns or symptoms today  Goals Unmet:  Not Applicable  Comments: Pt able to follow exercise prescription today without complaint.  Will continue to monitor for progression.    Dr. Bethann Punches is Medical Director for [facility] Cardiac Rehabilitation.  Dr. Vida Rigger is Medical Director for [facility] Pulmonary Rehabilitation.

## 2023-03-01 DIAGNOSIS — Z951 Presence of aortocoronary bypass graft: Secondary | ICD-10-CM

## 2023-03-01 DIAGNOSIS — Z48812 Encounter for surgical aftercare following surgery on the circulatory system: Secondary | ICD-10-CM | POA: Diagnosis not present

## 2023-03-01 NOTE — Progress Notes (Signed)
Daily Session Note  Patient Details  Name: Corey Barnes MRN: [id number] Date of Birth: [birth year] Referring Provider:   Flowsheet Row Cardiac Rehab from 01/16/2023 in [facility] Cardiac and Pulmonary Rehab  Referring Provider Dr. Massie Maroon, MD       Encounter Date: 03/01/2023  Check In:  Session Check In - 03/01/23 1118       Check-In   Supervising physician immediately available to respond to emergencies See telemetry face sheet for immediately available ER MD    Location [facility]-Cardiac & Pulmonary Rehab    Staff Present Kelton Pillar RN,BSN,MPA;Margaret Best, MS, Exercise Physiologist;Meredith Jewel Baize RN,BSN;Joseph Reino Kent RCP,RRT,BSRT    Virtual Visit No    Medication changes reported     No    Fall or balance concerns reported    No    Tobacco Cessation No Change    Warm-up and Cool-down Performed on first and last piece of equipment    Resistance Training Performed Yes    VAD Patient? No    PAD/SET Patient? No      Pain Assessment   Currently in Pain? No/denies                Social History   Tobacco Use  Smoking Status Former   Current packs/day: 0.50   Average packs/day: 0.5 packs/day for 32.0 years (16.0 ttl pk-yrs)   Types: Cigarettes  Smokeless Tobacco Never    Goals Met:  Independence with exercise equipment Exercise tolerated well No report of concerns or symptoms today Strength training completed today  Goals Unmet:  Not Applicable  Comments: Pt able to follow exercise prescription today without complaint.  Will continue to monitor for progression.    Dr. Bethann Punches is Medical Director for [facility] Cardiac Rehabilitation.  Dr. Vida Rigger is Medical Director for [facility] Pulmonary Rehabilitation.

## 2023-03-06 ENCOUNTER — Encounter: Payer: Medicaid Other | Attending: Cardiology

## 2023-03-06 DIAGNOSIS — Z951 Presence of aortocoronary bypass graft: Secondary | ICD-10-CM | POA: Diagnosis present

## 2023-03-06 DIAGNOSIS — Z48812 Encounter for surgical aftercare following surgery on the circulatory system: Secondary | ICD-10-CM | POA: Insufficient documentation

## 2023-03-06 NOTE — Progress Notes (Signed)
Daily Session Note  Patient Details  Name: Corey Barnes MRN: [id number] Date of Birth: [birth year] Referring Provider:   Flowsheet Row Cardiac Rehab from 01/16/2023 in [facility] Cardiac and Pulmonary Rehab  Referring Provider Dr. Massie Maroon, MD       Encounter Date: 03/06/2023  Check In:  Session Check In - 03/06/23 1118       Check-In   Supervising physician immediately available to respond to emergencies See telemetry face sheet for immediately available ER MD    Location [facility]-Cardiac & Pulmonary Rehab    Staff Present Kelton Pillar RN,BSN,MPA;Margaret Best, MS, Exercise Physiologist;Maxon Conetta BS, Exercise Physiologist;Filemon Breton Cloretta Ned, ACSM CEP, Exercise Physiologist    Virtual Visit No    Medication changes reported     No    Fall or balance concerns reported    No    Tobacco Cessation No Change    Warm-up and Cool-down Performed on first and last piece of equipment    Resistance Training Performed Yes    VAD Patient? No    PAD/SET Patient? No      Pain Assessment   Currently in Pain? No/denies                Social History   Tobacco Use  Smoking Status Former   Current packs/day: 0.50   Average packs/day: 0.5 packs/day for 32.0 years (16.0 ttl pk-yrs)   Types: Cigarettes  Smokeless Tobacco Never    Goals Met:  Independence with exercise equipment Exercise tolerated well Personal goals reviewed No report of concerns or symptoms today Strength training completed today  Goals Unmet:  Not Applicable  Comments: Pt able to follow exercise prescription today without complaint.  Will continue to monitor for progression.   Reviewed home exercise with pt today.  Pt plans to walk at home for exercise.  Reviewed THR, pulse, RPE, sign and symptoms, pulse oximetery and when to call 911 or MD.  Also discussed weather considerations and indoor options.  Pt voiced understanding.    Dr. Bethann Punches is Medical Director for [facility] Cardiac Rehabilitation.   Dr. Vida Rigger is Medical Director for [facility] Pulmonary Rehabilitation.

## 2023-03-08 ENCOUNTER — Encounter: Payer: Medicaid Other | Admitting: *Deleted

## 2023-03-08 DIAGNOSIS — Z951 Presence of aortocoronary bypass graft: Secondary | ICD-10-CM

## 2023-03-08 DIAGNOSIS — Z48812 Encounter for surgical aftercare following surgery on the circulatory system: Secondary | ICD-10-CM | POA: Diagnosis not present

## 2023-03-08 NOTE — Progress Notes (Signed)
 Daily Session Note  Patient Details  Name: Corey Barnes MRN: [id number] Date of Birth: 14-Oct-1970 Referring Provider:   Flowsheet Row Cardiac Rehab from 01/16/2023 in [facility] Cardiac and Pulmonary Rehab  Referring Provider Dr. Margery Ruth, MD       Encounter Date: 03/08/2023  Check In:  Session Check In - 03/08/23 1121       Check-In   Supervising physician immediately available to respond to emergencies See telemetry face sheet for immediately available ER MD    Location [facility]-Cardiac & Pulmonary Rehab    Staff Present Rollene Paterson, MS, Exercise Physiologist;Burnard Enis, RN, BSN, CCRP;Meredith Tressa RN,BSN;[employer]    Virtual Visit No    Medication changes reported     No    Fall or balance concerns reported    No    Warm-up and Cool-down Performed on first and last piece of equipment    Resistance Training Performed Yes    VAD Patient? No    PAD/SET Patient? No      Pain Assessment   Currently in Pain? No/denies                Social History   Tobacco Use  Smoking Status Former   Current packs/day: 0.50   Average packs/day: 0.5 packs/day for 32.0 years (16.0 ttl pk-yrs)   Types: Cigarettes  Smokeless Tobacco Never    Goals Met:  Independence with exercise equipment Exercise tolerated well No report of concerns or symptoms today  Goals Unmet:  Not Applicable  Comments: Pt able to follow exercise prescription today without complaint.  Will continue to monitor for progression.    Dr. Oneil Pinal is Medical Director for [facility] Cardiac Rehabilitation.  Dr. Fuad Aleskerov is Medical Director for [facility] Pulmonary Rehabilitation.

## 2023-03-13 ENCOUNTER — Encounter: Payer: Medicaid Other | Admitting: *Deleted

## 2023-03-13 DIAGNOSIS — Z48812 Encounter for surgical aftercare following surgery on the circulatory system: Secondary | ICD-10-CM | POA: Diagnosis not present

## 2023-03-13 DIAGNOSIS — Z951 Presence of aortocoronary bypass graft: Secondary | ICD-10-CM

## 2023-03-13 NOTE — Progress Notes (Signed)
 Daily Session Note  Patient Details  Name: EVEN POLIO MRN: [id number] Date of Birth: 1970-07-12 Referring Provider:   Flowsheet Row Cardiac Rehab from 01/16/2023 in [facility] Cardiac and Pulmonary Rehab  Referring Provider Dr. Teddie Favre, MD       Encounter Date: 03/13/2023  Check In:  Session Check In - 03/13/23 1134       Check-In   Supervising physician immediately available to respond to emergencies See telemetry face sheet for immediately available ER MD    Location [facility]-Cardiac & Pulmonary Rehab    Staff Present Maud Sorenson, RN, BSN, CCRP;Meredith Craven RN,BSN;Maxon [employer], Exercise Physiologist;Margaret Best, MS, Exercise Physiologist;Kelly Sabra Cramp BS, ACSM CEP, Exercise Physiologist    Virtual Visit No    Medication changes reported     No    Fall or balance concerns reported    No    Warm-up and Cool-down Performed on first and last piece of equipment    Resistance Training Performed Yes    VAD Patient? No    PAD/SET Patient? No      Pain Assessment   Currently in Pain? No/denies                Social History   Tobacco Use  Smoking Status Former   Current packs/day: 0.50   Average packs/day: 0.5 packs/day for 32.0 years (16.0 ttl pk-yrs)   Types: Cigarettes  Smokeless Tobacco Never    Goals Met:  Independence with exercise equipment Exercise tolerated well No report of concerns or symptoms today  Goals Unmet:  Not Applicable  Comments: Pt able to follow exercise prescription today without complaint.  Will continue to monitor for progression.    Dr. Firman Hughes is Medical Director for [facility] Cardiac Rehabilitation.  Dr. Fuad Aleskerov is Medical Director for [facility] Pulmonary Rehabilitation.

## 2023-03-15 ENCOUNTER — Encounter: Payer: Medicaid Other | Admitting: *Deleted

## 2023-03-15 ENCOUNTER — Encounter: Payer: Self-pay | Admitting: *Deleted

## 2023-03-15 DIAGNOSIS — Z951 Presence of aortocoronary bypass graft: Secondary | ICD-10-CM

## 2023-03-15 DIAGNOSIS — Z48812 Encounter for surgical aftercare following surgery on the circulatory system: Secondary | ICD-10-CM | POA: Diagnosis not present

## 2023-03-15 NOTE — Progress Notes (Signed)
 Cardiac Individual Treatment Plan  Patient Details  Name: Corey Barnes MRN: [id number] Date of Birth: 06-05-1970 Referring Provider:   Flowsheet Row Cardiac Rehab from 01/16/2023 in [facility] Cardiac and Pulmonary Rehab  Referring Provider Dr. Massie Maroon, MD       Initial Encounter Date:  Flowsheet Row Cardiac Rehab from 01/16/2023 in [facility] Cardiac and Pulmonary Rehab  Date 01/16/23       Visit Diagnosis: S/P CABG x 2  Patient's Home Medications on Admission:  Current Outpatient Medications:    acetaminophen (TYLENOL) 500 MG tablet, Take 500 mg by mouth every 8 (eight) hours as needed., Disp: , Rfl:    albuterol (PROVENTIL HFA;VENTOLIN HFA) 108 (90 Base) MCG/ACT inhaler, Inhale 2 puffs into the lungs every 6 (six) hours as needed for wheezing or shortness of breath. (Patient not taking: Reported on 01/12/2023), Disp: 1 Inhaler, Rfl: 0   aspirin EC 81 MG tablet, Take 1 tablet by mouth daily., Disp: , Rfl:    atorvastatin (LIPITOR) 20 MG tablet, Take 1 tablet (20 mg total) by mouth daily. (Patient not taking: Reported on 01/12/2023), Disp: 90 tablet, Rfl: 1   ciprofloxacin-dexamethasone (CIPRODEX) OTIC suspension, Place 4 drops into the right ear 2 (two) times daily. (Patient not taking: Reported on 01/12/2023), Disp: 7.5 mL, Rfl: 0   diltiazem (CARDIZEM CD) 120 MG 24 hr capsule, Take 1 capsule by mouth daily., Disp: , Rfl:    ezetimibe (ZETIA) 10 MG tablet, Take 10 mg by mouth daily., Disp: , Rfl:    hydrOXYzine (ATARAX) 25 MG tablet, Take 1 tablet (25 mg total) by mouth 3 (three) times daily as needed for anxiety., Disp: 20 tablet, Rfl: 0   losartan (COZAAR) 50 MG tablet, TAKE 1 TABLET BY MOUTH EVERY DAY (Patient not taking: Reported on 01/12/2023), Disp: 90 tablet, Rfl: 1   meloxicam (MOBIC) 15 MG tablet, Take 1 tablet (15 mg total) by mouth daily as needed for pain. (Patient not taking: Reported on 01/12/2023), Disp: 30 tablet, Rfl: 0   metoprolol succinate (TOPROL-XL) 25 MG 24 hr  tablet, Take 12.5 mg by mouth daily., Disp: , Rfl:    naproxen (NAPROSYN) 500 MG tablet, Take 1 tablet (500 mg total) by mouth 2 (two) times daily with a meal. (Patient not taking: Reported on 01/12/2023), Disp: 20 tablet, Rfl: 0   REPATHA SURECLICK 140 MG/ML SOAJ, Inject 140 mg into the skin every 14 (fourteen) days., Disp: , Rfl:   Past Medical History: Past Medical History:  Diagnosis Date   Coronary artery disease involving native coronary artery of native heart 09/21/2022   High cholesterol    Hypertension    Mixed hyperlipidemia 04/04/2022   Neck pain 04/04/2022   Pain of both sacroiliac joints 04/04/2022    Tobacco Use: Social History   Tobacco Use  Smoking Status Former   Current packs/day: 0.50   Average packs/day: 0.5 packs/day for 32.0 years (16.0 ttl pk-yrs)   Types: Cigarettes  Smokeless Tobacco Never    Labs: Review Flowsheet       Latest Ref Rng & Units 04/04/2022  Labs for ITP Cardiac and Pulmonary Rehab  Cholestrol 100 - 199 mg/dL 478   LDL (calc) 0 - 99 mg/dL 295   HDL-C >62 mg/dL 34   Trlycerides 0 - 130 mg/dL 865   Hemoglobin H8I 4.8 - 5.6 % 5.7      Exercise Target Goals: Exercise Program Goal: Individual exercise prescription set using results from initial 6 min walk test and THRR while  considering  patient's activity barriers and safety.   Exercise Prescription Goal: Initial exercise prescription builds to 30-45 minutes a day of aerobic activity, 2-3 days per week.  Home exercise guidelines will be given to patient during program as part of exercise prescription that the participant will acknowledge.   Education: Aerobic Exercise: - Group verbal and visual presentation on the components of exercise prescription. Introduces F.I.T.T principle from ACSM for exercise prescriptions.  Reviews F.I.T.T. principles of aerobic exercise including progression. Written material given at graduation. Flowsheet Row Cardiac Rehab from 01/16/2023 in [facility] Cardiac  and Pulmonary Rehab  Education need identified 01/16/23       Education: Resistance Exercise: - Group verbal and visual presentation on the components of exercise prescription. Introduces F.I.T.T principle from ACSM for exercise prescriptions  Reviews F.I.T.T. principles of resistance exercise including progression. Written material given at graduation.    Education: Exercise & Equipment Safety: - Individual verbal instruction and demonstration of equipment use and safety with use of the equipment. Flowsheet Row Cardiac Rehab from 01/16/2023 in [facility] Cardiac and Pulmonary Rehab  Date 01/16/23  Educator NT  Instruction Review Code 1- Verbalizes Understanding       Education: Exercise Physiology & General Exercise Guidelines: - Group verbal and written instruction with models to review the exercise physiology of the cardiovascular system and associated critical values. Provides general exercise guidelines with specific guidelines to those with heart or lung disease.    Education: Flexibility, Balance, Mind/Body Relaxation: - Group verbal and visual presentation with interactive activity on the components of exercise prescription. Introduces F.I.T.T principle from ACSM for exercise prescriptions. Reviews F.I.T.T. principles of flexibility and balance exercise training including progression. Also discusses the mind body connection.  Reviews various relaxation techniques to help reduce and manage stress (i.e. Deep breathing, progressive muscle relaxation, and visualization). Balance handout provided to take home. Written material given at graduation.   Activity Barriers & Risk Stratification:  Activity Barriers & Cardiac Risk Stratification - 01/16/23 1557       Activity Barriers & Cardiac Risk Stratification   Activity Barriers Joint Problems;Shortness of Breath;Neck/Spine Problems;Balance Concerns;History of Falls   L knee 30% disabled, R knee weak at times, neck hure in accident in past    Cardiac Risk Stratification High             6 Minute Walk:  6 Minute Walk     Row Name 01/16/23 1610         6 Minute Walk   Phase Initial     Distance 700 feet     Walk Time 6 minutes     # of Rest Breaks 0     MPH 1.33     METS 3.07     RPE 9     Perceived Dyspnea  2     VO2 Peak 10.74     Symptoms Yes (comment)     Comments knee pain 8/10, limp     Resting HR 98 bpm     Resting BP 122/82     Resting Oxygen Saturation  97 %     Exercise Oxygen Saturation  during 6 min walk 97 %     Max Ex. HR 114 bpm     Max Ex. BP 136/86     2 Minute Post BP 120/78              Oxygen Initial Assessment:   Oxygen Re-Evaluation:   Oxygen Discharge (Final Oxygen Re-Evaluation):   Initial Exercise  Prescription:  Initial Exercise Prescription - 01/16/23 1600       Date of Initial Exercise RX and Referring Provider   Date 01/16/23    Referring Provider Dr. Massie Maroon, MD      Oxygen   Maintain Oxygen Saturation 88% or higher      Treadmill   MPH 1.3    Grade 0    Minutes 15    METs 2      NuStep   Level 2    SPM 80    Minutes 15    METs 3.07      Biostep-RELP   Level 1    SPM 50    Minutes 15    METs 3.07      Track   Laps 15    Minutes 15    METs 1.82      Prescription Details   Frequency (times per week) 2    Duration Progress to 30 minutes of continuous aerobic without signs/symptoms of physical distress      Intensity   THRR 40-80% of Max Heartrate 126-155    Ratings of Perceived Exertion 11-15    Perceived Dyspnea 0-4      Progression   Progression Continue to progress workloads to maintain intensity without signs/symptoms of physical distress.      Resistance Training   Training Prescription Yes    Weight 4 lb    Reps 10-15             Perform Capillary Blood Glucose checks as needed.  Exercise Prescription Changes:   Exercise Prescription Changes     Row Name 01/16/23 1600 01/24/23 1200 02/09/23 1400 02/22/23 0700  03/06/23 1100     Response to Exercise   Blood Pressure (Admit) 122/82 124/76 142/82 108/60 --   Blood Pressure (Exercise) 136/86 138/78 156/90 132/64 --   Blood Pressure (Exit) 120/78 106/76 128/78 106/64 --   Heart Rate (Admit) 98 bpm 97 bpm 121 bpm 87 bpm --   Heart Rate (Exercise) 114 bpm 117 bpm 109 bpm 125 bpm --   Heart Rate (Exit) 97 bpm 92 bpm 99 bpm 101 bpm --   Oxygen Saturation (Admit) 97 % -- -- -- --   Oxygen Saturation (Exercise) 97 % -- -- -- --   Rating of Perceived Exertion (Exercise) 9 13 13 13  --   Perceived Dyspnea (Exercise) 2 -- 0 -- --   Symptoms knee pain 8/10, limp left knee pain none none --   Comments Results First full day of exercise -- -- --   Duration -- Progress to 30 minutes of  aerobic without signs/symptoms of physical distress Progress to 30 minutes of  aerobic without signs/symptoms of physical distress Continue with 30 min of aerobic exercise without signs/symptoms of physical distress. Continue with 30 min of aerobic exercise without signs/symptoms of physical distress.   Intensity -- THRR unchanged THRR unchanged THRR unchanged THRR unchanged     Progression   Progression -- Continue to progress workloads to maintain intensity without signs/symptoms of physical distress. Continue to progress workloads to maintain intensity without signs/symptoms of physical distress. Continue to progress workloads to maintain intensity without signs/symptoms of physical distress. Continue to progress workloads to maintain intensity without signs/symptoms of physical distress.   Average METs -- 1.5 1.61 1.87 1.87     Resistance Training   Training Prescription -- Yes Yes Yes Yes   Weight -- 4 lb 4 lb 4 lb 4 lb   Reps --  10-15 10-15 10-15 10-15     Interval Training   Interval Training -- No No No No     Treadmill   MPH -- 1.3 1.2 1.3 1.3   Grade -- 0 0 0 0   Minutes -- 15 15 15 15    METs -- 1.99 1.92 1.99 1.99     NuStep   Level -- -- -- 2 2   SPM --  -- -- 80 80   Minutes -- -- -- 15 15   METs -- -- -- 1.6 1.6     Biostep-RELP   Level -- 1 1 1 1    Minutes -- 15 15 15 15    METs -- 1 1.3 2 2      Home Exercise Plan   Plans to continue exercise at -- -- -- -- Home (comment)  walking at home   Frequency -- -- -- -- Add 2 additional days to program exercise sessions.   Initial Home Exercises Provided -- -- -- -- 03/06/23     Oxygen   Maintain Oxygen Saturation -- 88% or higher 88% or higher 88% or higher 88% or higher    Row Name 03/08/23 1000             Response to Exercise   Blood Pressure (Admit) 116/64       Blood Pressure (Exercise) 130/68       Blood Pressure (Exit) 118/62       Heart Rate (Admit) 103 bpm       Heart Rate (Exercise) 116 bpm       Heart Rate (Exit) 94 bpm       Rating of Perceived Exertion (Exercise) 14       Perceived Dyspnea (Exercise) 0       Symptoms none       Duration Continue with 30 min of aerobic exercise without signs/symptoms of physical distress.       Intensity THRR unchanged         Progression   Progression Continue to progress workloads to maintain intensity without signs/symptoms of physical distress.       Average METs 1.7         Resistance Training   Training Prescription Yes       Weight 4 lb       Reps 10-15         Interval Training   Interval Training No         Treadmill   MPH 1.5       Grade 0       Minutes 15       METs 2.15         NuStep   Level 2       Minutes 15       METs 1.1         Biostep-RELP   Level 1       Minutes 15       METs 1         Home Exercise Plan   Plans to continue exercise at Home (comment)  walking at home       Frequency Add 2 additional days to program exercise sessions.       Initial Home Exercises Provided 03/06/23         Oxygen   Maintain Oxygen Saturation 88% or higher                Exercise Comments:   Exercise Comments  Row Name 01/18/23 1134           Exercise Comments First full day of exercise!   Patient was oriented to gym and equipment including functions, settings, policies, and procedures.  Patient's individual exercise prescription and treatment plan were reviewed.  All starting workloads were established based on the results of the 6 minute walk test done at initial orientation visit.  The plan for exercise progression was also introduced and progression will be customized based on patient's performance and goals.                Exercise Goals and Review:   Exercise Goals     Row Name 01/16/23 1558             Exercise Goals   Increase Physical Activity Yes       Intervention Provide advice, education, support and counseling about physical activity/exercise needs.;Develop an individualized exercise prescription for aerobic and resistive training based on initial evaluation findings, risk stratification, comorbidities and participant's personal goals.       Expected Outcomes Short Term: Attend rehab on a regular basis to increase amount of physical activity.;Long Term: Add in home exercise to make exercise part of routine and to increase amount of physical activity.;Long Term: Exercising regularly at least 3-5 days a week.       Increase Strength and Stamina Yes       Intervention Provide advice, education, support and counseling about physical activity/exercise needs.;Develop an individualized exercise prescription for aerobic and resistive training based on initial evaluation findings, risk stratification, comorbidities and participant's personal goals.       Expected Outcomes Short Term: Perform resistance training exercises routinely during rehab and add in resistance training at home;Long Term: Improve cardiorespiratory fitness, muscular endurance and strength as measured by increased METs and functional capacity ( );Short Term: Increase workloads from initial exercise prescription for resistance, speed, and METs.       Able to understand and use rate of perceived  exertion (RPE) scale Yes       Intervention Provide education and explanation on how to use RPE scale       Expected Outcomes Short Term: Able to use RPE daily in rehab to express subjective intensity level;Long Term:  Able to use RPE to guide intensity level when exercising independently       Able to understand and use Dyspnea scale Yes       Intervention Provide education and explanation on how to use Dyspnea scale       Expected Outcomes Short Term: Able to use Dyspnea scale daily in rehab to express subjective sense of shortness of breath during exertion;Long Term: Able to use Dyspnea scale to guide intensity level when exercising independently       Knowledge and understanding of Target Heart Rate Range (THRR) Yes       Intervention Provide education and explanation of THRR including how the numbers were predicted and where they are located for reference       Expected Outcomes Short Term: Able to state/look up THRR;Long Term: Able to use THRR to govern intensity when exercising independently;Short Term: Able to use daily as guideline for intensity in rehab       Able to check pulse independently Yes       Intervention Provide education and demonstration on how to check pulse in carotid and radial arteries.;Review the importance of being able to check your own pulse for safety during independent exercise  Expected Outcomes Short Term: Able to explain why pulse checking is important during independent exercise;Long Term: Able to check pulse independently and accurately       Understanding of Exercise Prescription Yes       Intervention Provide education, explanation, and written materials on patient's individual exercise prescription       Expected Outcomes Short Term: Able to explain program exercise prescription;Long Term: Able to explain home exercise prescription to exercise independently                Exercise Goals Re-Evaluation :  Exercise Goals Re-Evaluation     Row Name  01/18/23 1134 01/24/23 1242 02/09/23 1433 02/22/23 0804 02/27/23 1131     Exercise Goal Re-Evaluation   Exercise Goals Review Able to understand and use rate of perceived exertion (RPE) scale;Able to understand and use Dyspnea scale;Knowledge and understanding of Target Heart Rate Range (THRR);Understanding of Exercise Prescription Understanding of Exercise Prescription;Increase Physical Activity;Increase Strength and Stamina Understanding of Exercise Prescription;Increase Physical Activity;Increase Strength and Stamina Understanding of Exercise Prescription;Increase Physical Activity;Increase Strength and Stamina Increase Physical Activity;Understanding of Exercise Prescription;Increase Strength and Stamina   Comments Reviewed RPE and dyspnea scale, THR and program prescription with pt today.  Pt voiced understanding and was given a copy of goals to take home. Corey Barnes is off to a good start in the program. He did well during his first day on the treadmill at a speed of 1.3 mph with no incline. He also worked at level 1 on the biostep and used 4 lb hand weights for resistance training. We will continue to monitor his progress in the program. Corey Barnes is doing well in rehab. He was only able to attend one session during this review session. During this one session he was able to maintain his workload on the treadmill as well as his level on the biostep. We will continue to monitor his progress in the program. Corey Barnes is doing well in rehab. He increased his speed on the treadmill to 1. and did level 2 on the T4 nustep. We will continue to monitor his progress in the program. He is doing well here at rehab. Likes to walk on treamill. Currently on T4 swithing between level 2 and level 1 depending on his strengh. His left leg can give him problems at times, he has a fall last week in the snow and feeling sore. He is walking his driveway 3-4 times per day when weather is safe.   Expected Outcomes Short: Use RPE daily  to regulate intensity. Long: Follow program prescription in THR. Short: Continue to follow current exercise prescription. Long: Continue exercise to improve strength and stamina. Short: Continue to follow current exercise prescription. Long: Continue exercise to improve strength and stamina. Short: Continue to progressively increase treadmill and T4 nustep workloads. Long: Continue exercise to improve strength and stamina. STg: increase workload as able. LTG: continue to exercise and improve strength and stamina    Row Name 03/06/23 1150 03/08/23 1046           Exercise Goal Re-Evaluation   Exercise Goals Review Increase Physical Activity;Able to understand and use rate of perceived exertion (RPE) scale;Knowledge and understanding of Target Heart Rate Range (THRR);Understanding of Exercise Prescription;Increase Strength and Stamina;Able to understand and use Dyspnea scale;Able to check pulse independently Increase Physical Activity;Increase Strength and Stamina;Understanding of Exercise Prescription      Comments Reviewed home exercise with pt today.  Pt plans to walk at home for exercise.  Reviewed THR,  pulse, RPE, sign and symptoms, pulse oximetery and when to call 911 or MD.  Also discussed weather considerations and indoor options.  Pt voiced understanding. Corey Barnes is doing well in rehab. He was recently able to increase his speed on the treadmill to 1. with no incline. He was also able to maintain his level on the Biostep, T4, and XR. We will continue to monitor and encourage his progress in the program.      Expected Outcomes Short: add 1-2 days of walking at home on off days off days of cardiac rehab. Long: maintain independent exercise routine upon graduation from cardiac rehab. Short: Increase to level 2 on the biostep. Long: Continue exercise to improve strength and stamina.               Discharge Exercise Prescription (Final Exercise Prescription Changes):  Exercise Prescription  Changes - 03/08/23 1000       Response to Exercise   Blood Pressure (Admit) 116/64    Blood Pressure (Exercise) 130/68    Blood Pressure (Exit) 118/62    Heart Rate (Admit) 103 bpm    Heart Rate (Exercise) 116 bpm    Heart Rate (Exit) 94 bpm    Rating of Perceived Exertion (Exercise) 14    Perceived Dyspnea (Exercise) 0    Symptoms none    Duration Continue with 30 min of aerobic exercise without signs/symptoms of physical distress.    Intensity THRR unchanged      Progression   Progression Continue to progress workloads to maintain intensity without signs/symptoms of physical distress.    Average METs 1.7      Resistance Training   Training Prescription Yes    Weight 4 lb    Reps 10-15      Interval Training   Interval Training No      Treadmill   MPH 1.5    Grade 0    Minutes 15    METs 2.15      NuStep   Level 2    Minutes 15    METs 1.1      Biostep-RELP   Level 1    Minutes 15    METs 1      Home Exercise Plan   Plans to continue exercise at Home (comment)   walking at home   Frequency Add 2 additional days to program exercise sessions.    Initial Home Exercises Provided 03/06/23      Oxygen   Maintain Oxygen Saturation 88% or higher             Nutrition:  Target Goals: Understanding of nutrition guidelines, daily intake of sodium 1500mg , cholesterol 200mg , calories 30% from fat and 7% or less from saturated fats, daily to have 5 or more servings of fruits and vegetables.  Education: All About Nutrition: -Group instruction provided by verbal, written material, interactive activities, discussions, models, and posters to present general guidelines for heart healthy nutrition including fat, fiber, MyPlate, the role of sodium in heart healthy nutrition, utilization of the nutrition label, and utilization of this knowledge for meal planning. Follow up email sent as well. Written material given at graduation. Flowsheet Row Cardiac Rehab from 01/16/2023  in [facility] Cardiac and Pulmonary Rehab  Education need identified 01/16/23       Biometrics:  Pre Biometrics - 01/16/23 1613       Pre Biometrics   Height 5\' 11"  (1.803 m)    Weight 210 lb 4.8 oz (95.4 kg)  Waist Circumference 40 inches    Hip Circumference 42 inches    Waist to Hip Ratio 0.95 %    BMI (Calculated) 29.34    Single Leg Stand 10.5 seconds             Post Biometrics - 01/16/23 1559        Post  Biometrics   Height --    Weight --    Waist Circumference --    Hip Circumference --    Waist to Hip Ratio --    BMI (Calculated) --    Single Leg Stand --             Nutrition Therapy Plan and Nutrition Goals:  Nutrition Therapy & Goals - 01/18/23 1526       Nutrition Therapy   Diet Cardiac, Low Na    Protein (specify units) 90    Fiber 30 grams    Whole Grain Foods 3 servings    Saturated Fats 15 max. grams    Fruits and Vegetables 5 servings/day    Sodium 2 grams      Personal Nutrition Goals   Nutrition Goal Eat 15-30gProtein and 30-60gCarbs at each meal.    Personal Goal #2 Read labels and reduce sodium intake to below 2300mg . Ideally 1500mg  per day.    Personal Goal #3 Reduce saturated fat, less than 12g per day. Replace bad fats for more heart healthy fats.    Comments Patient drinking mostly water, ~48-64oz daily. 1 can of soda per day. He has been gaining weight since he has become less physically active. He is trying to lose some weight with diet. He reports he rarely eats breakfast or lunch and will usually eat dinner. Spoke with him about the importance of spreading his calories and protein intake with fiber and healthy fats throughout the day. Encouraged him to log on myfitnesspal for a free resource to assess if he is under eating. Reviewed Mediterranean diet handout, educated on types of fats, sources, and how to read labels. Spoke about cutting back on sodium, he has already stopped using salt for cooking and seasoning. Reminded him to  read labels as well, since much of sodium intake can come from processed and packaged foods. Built out several meals and snacks with foods he likes and will eat focusing on nutrient dense foods that will help him meet his protein, fiber, and calorie goals while keeping quantity of food low.      Intervention Plan   Intervention Prescribe, educate and counsel regarding individualized specific dietary modifications aiming towards targeted core components such as weight, hypertension, lipid management, diabetes, heart failure and other comorbidities.;Nutrition handout(s) given to patient.    Expected Outcomes Short Term Goal: Understand basic principles of dietary content, such as calories, fat, sodium, cholesterol and nutrients.;Short Term Goal: A plan has been developed with personal nutrition goals set during dietitian appointment.;Long Term Goal: Adherence to prescribed nutrition plan.             Nutrition Assessments:  MEDIFICTS Score Key: >=[id number] Need to make dietary changes  40-70 Heart Healthy Diet <= 40 Therapeutic Level Cholesterol Diet  Flowsheet Row Cardiac Rehab from 01/12/2023 in [facility] Cardiac and Pulmonary Rehab  Picture Your Plate Total Score on Admission 59      Picture Your Plate Scores: <16 Unhealthy dietary pattern with much room for improvement. 41-50 Dietary pattern unlikely to meet recommendations for good health and room for improvement. 51-60 More healthful dietary pattern, with some  room for improvement.  >60 Healthy dietary pattern, although there may be some specific behaviors that could be improved.    Nutrition Goals Re-Evaluation:  Nutrition Goals Re-Evaluation     Row Name 02/06/23 1131 02/27/23 1138           Goals   Nutrition Goal Eat 15-30gProtein and 30-60gCarbs at each meal. --      Comment Corey Barnes reports he has had a hard time working on nutrition goals due to limited income and what he can afford to buy. He states that he tries his best to  incorporate fruits, vegetables, and protein when he can afford it. He reports he has done better eating more food throughout the day and not just at one meal. He is reading labels and trying to limit sodium and saturated fat. Discussed some easy cheaper meal ideas to try and help him include more food without exceeding sodium and saturated fat limits.      Expected Outcome Short: be mindful of what he is eating and try to encorporate fresh foods when budget allows. Try to work on reading food labels.  Long: be able to maintain heart healthy diet as budget allows. STG: eat 2-3 meals per day , include healthy fats and low sodium frozen meals id needed. LTG: maintain heart healthy lifestyle changes        Personal Goal #2 Re-Evaluation   Personal Goal #2 Read labels and reduce sodium intake to below 2300mg . Ideally 1500mg  per day. --        Personal Goal #3 Re-Evaluation   Personal Goal #3 Reduce saturated fat, less than 12g per day. Replace bad fats for more heart healthy fats. --               Nutrition Goals Discharge (Final Nutrition Goals Re-Evaluation):  Nutrition Goals Re-Evaluation - 02/27/23 1138       Goals   Comment He reports he has done better eating more food throughout the day and not just at one meal. He is reading labels and trying to limit sodium and saturated fat. Discussed some easy cheaper meal ideas to try and help him include more food without exceeding sodium and saturated fat limits.    Expected Outcome STG: eat 2-3 meals per day , include healthy fats and low sodium frozen meals id needed. LTG: maintain heart healthy lifestyle changes             Psychosocial: Target Goals: Acknowledge presence or absence of significant depression and/or stress, maximize coping skills, provide positive support system. Participant is able to verbalize types and ability to use techniques and skills needed for reducing stress and depression.   Education: Stress, Anxiety, and  Depression - Group verbal and visual presentation to define topics covered.  Reviews how body is impacted by stress, anxiety, and depression.  Also discusses healthy ways to reduce stress and to treat/manage anxiety and depression.  Written material given at graduation.   Education: Sleep Hygiene -Provides group verbal and written instruction about how sleep can affect your health.  Define sleep hygiene, discuss sleep cycles and impact of sleep habits. Review good sleep hygiene tips.    Initial Review & Psychosocial Screening:  Initial Psych Review & Screening - 01/12/23 1050       Initial Review   Current issues with Current Anxiety/Panic;Current Stress Concerns    Source of Stress Concerns Financial    Comments out of work  a year working on disability.   prescibed meds  for anxiety.  is working on trting to redirect thoughts to decrease anxiety      Family Dynamics   Good Support System? Yes   oldest kid, Mom, some friends     Barriers   Psychosocial barriers to participate in program There are no identifiable barriers or psychosocial needs.      Screening Interventions   Interventions Encouraged to exercise    Expected Outcomes Short Term goal: Utilizing psychosocial counselor, staff and physician to assist with identification of specific Stressors or current issues interfering with healing process. Setting desired goal for each stressor or current issue identified.;Long Term Goal: Stressors or current issues are controlled or eliminated.;Short Term goal: Identification and review with participant of any Quality of Life or Depression concerns found by scoring the questionnaire.;Long Term goal: The participant improves quality of Life and PHQ9 Scores as seen by post scores and/or verbalization of changes             Quality of Life Scores:   Quality of Life - 01/12/23 1115       Quality of Life   Select Quality of Life      Quality of Life Scores   Health/Function Pre 12.94  %    Socioeconomic Pre 11.13 %    Psych/Spiritual Pre 13.75 %    Family Pre 13.64 %    GLOBAL Pre 16.1 %            Scores of 19 and below usually indicate a poorer quality of life in these areas.  A difference of  2-3 points is a clinically meaningful difference.  A difference of 2-3 points in the total score of the Quality of Life Index has been associated with significant improvement in overall quality of life, self-image, physical symptoms, and general health in studies assessing change in quality of life.  PHQ-9: Review Flowsheet       02/27/2023 02/06/2023 01/16/2023  Depression screen PHQ 2/9  Decreased Interest 1 2 3   Down, Depressed, Hopeless 1 1 2   PHQ - 2 Score 2 3 5   Altered sleeping 3 3 3   Tired, decreased energy - 2 2  Change in appetite 0 2 2  Feeling bad or failure about yourself  2 3 1   Trouble concentrating 1 1 2   Moving slowly or fidgety/restless 0 0 0  Suicidal thoughts 0 0 0  PHQ-9 Score 8 14 15   Difficult doing work/chores Somewhat difficult Somewhat difficult Somewhat difficult   Interpretation of Total Score  Total Score Depression Severity:  1-4 = Minimal depression, 5-9 = Mild depression, 10-14 = Moderate depression, 15-19 = Moderately severe depression, 20-27 = Severe depression   Psychosocial Evaluation and Intervention:  Psychosocial Evaluation - 01/12/23 1109       Psychosocial Evaluation & Interventions   Interventions Encouraged to exercise with the program and follow exercise prescription;Relaxation education    Comments There are no barriers to attending the program. His Mom lives with him. She and his older son and smoe friends are his support.  He does have a history of anxiety and has meds to help as needed. He stated that he tries to think about other things to decrease the anxiety. He does have finacial concern as he has been out of work about a year. He is working with a Clinical research associate to get diasability.  He is ready to start the program and  work on feeling better    Expected Outcomes STG attend all sessions as scheduled, continue to  work on methods to decrease anxiety response.  LTG able to manage anxiety symptoms    Continue Psychosocial Services  Follow up required by staff             Psychosocial Re-Evaluation:  Psychosocial Re-Evaluation     Row Name 02/06/23 1138 02/27/23 1134           Psychosocial Re-Evaluation   Current issues with Current Anxiety/Panic;Current Stress Concerns Current Anxiety/Panic;Current Stress Concerns;Current Sleep Concerns      Comments Patient reevaluated with another PHQ questionaire. His score went down from a 15 to a 14. He struggles with the fact that most of his hobbies require being physical and having mobility, with which he is limiited. He also has financial stressors currently. Continues with prescribed treatment. Reevaluated PHQ questionaire, he improved from 14 to 8. He still struggles with losing hobbies due to cost of disability. Struggles to sleep most nights, reports anxiety is a big barrier to getting to sleep. He take his meds and has a psych evaluation coming up in a month or so.      Expected Outcomes Short: continue to exercise to try to gain strength and mobility so he can start doing hobbies he previously enjoyed. Long: maintian good mental health habits. STG: focus on improving strength, take meds as prescribed. LTG: achieve and maintain positive outlook on health and daily life      Interventions Encouraged to attend Cardiac Rehabilitation for the exercise Encouraged to attend Cardiac Rehabilitation for the exercise      Continue Psychosocial Services  Follow up required by staff Follow up required by staff               Psychosocial Discharge (Final Psychosocial Re-Evaluation):  Psychosocial Re-Evaluation - 02/27/23 1134       Psychosocial Re-Evaluation   Current issues with Current Anxiety/Panic;Current Stress Concerns;Current Sleep Concerns    Comments  Reevaluated PHQ questionaire, he improved from 14 to 8. He still struggles with losing hobbies due to cost of disability. Struggles to sleep most nights, reports anxiety is a big barrier to getting to sleep. He take his meds and has a psych evaluation coming up in a month or so.    Expected Outcomes STG: focus on improving strength, take meds as prescribed. LTG: achieve and maintain positive outlook on health and daily life    Interventions Encouraged to attend Cardiac Rehabilitation for the exercise    Continue Psychosocial Services  Follow up required by staff             Vocational Rehabilitation: Provide vocational rehab assistance to qualifying candidates.   Vocational Rehab Evaluation & Intervention:  Vocational Rehab - 01/12/23 1113       Initial Vocational Rehab Evaluation & Intervention   Assessment shows need for Vocational Rehabilitation No      Vocational Rehab Re-Evaulation   Comments applying for disability             Education: Education Goals: Education classes will be provided on a variety of topics geared toward better understanding of heart health and risk factor modification. Participant will state understanding/return demonstration of topics presented as noted by education test scores.  Learning Barriers/Preferences:   General Cardiac Education Topics:  AED/CPR: - Group verbal and written instruction with the use of models to demonstrate the basic use of the AED with the basic ABC's of resuscitation.   Anatomy and Cardiac Procedures: - Group verbal and visual presentation and models provide information about basic  cardiac anatomy and function. Reviews the testing methods done to diagnose heart disease and the outcomes of the test results. Describes the treatment choices: Medical Management, Angioplasty, or Coronary Bypass Surgery for treating various heart conditions including Myocardial Infarction, Angina, Valve Disease, and Cardiac Arrhythmias.   Written material given at graduation.   Medication Safety: - Group verbal and visual instruction to review commonly prescribed medications for heart and lung disease. Reviews the medication, class of the drug, and side effects. Includes the steps to properly store meds and maintain the prescription regimen.  Written material given at graduation.   Intimacy: - Group verbal instruction through game format to discuss how heart and lung disease can affect sexual intimacy. Written material given at graduation..   Know Your Numbers and Heart Failure: - Group verbal and visual instruction to discuss disease risk factors for cardiac and pulmonary disease and treatment options.  Reviews associated critical values for Overweight/Obesity, Hypertension, Cholesterol, and Diabetes.  Discusses basics of heart failure: signs/symptoms and treatments.  Introduces Heart Failure Zone chart for action plan for heart failure.  Written material given at graduation. Flowsheet Row Cardiac Rehab from 01/16/2023 in [facility] Cardiac and Pulmonary Rehab  Education need identified 01/16/23       Infection Prevention: - Provides verbal and written material to individual with discussion of infection control including proper hand washing and proper equipment cleaning during exercise session. Flowsheet Row Cardiac Rehab from 01/16/2023 in [facility] Cardiac and Pulmonary Rehab  Date 01/16/23  Educator NT  Instruction Review Code 1- Verbalizes Understanding       Falls Prevention: - Provides verbal and written material to individual with discussion of falls prevention and safety. Flowsheet Row Cardiac Rehab from 01/16/2023 in [facility] Cardiac and Pulmonary Rehab  Date 01/12/23  Educator SB  Instruction Review Code 1- Verbalizes Understanding       Other: -Provides group and verbal instruction on various topics (see comments)   Knowledge Questionnaire Score:  Knowledge Questionnaire Score - 01/12/23 1117        Knowledge Questionnaire Score   Pre Score 23/26  missed NTG, nutrition and exercise             Core Components/Risk Factors/Patient Goals at Admission:  Personal Goals and Risk Factors at Admission - 01/12/23 1138       Core Components/Risk Factors/Patient Goals on Admission   Number of packs per day Corey Barnes is a former tobacco user. Intervention for tobacco cessation was provided at the initial medical review. He was asked about when he  quit and reported 11/14/2022 right before his surgery . Patient was advised and educated about tobacco cessation using combination therapy, tobacco cessation classes, quit line, and quit smoking apps. Patient demonstrated understanding of this material. Staff will continue to provide encouragement and follow up with the patient throughout the progra             Education:Diabetes - Individual verbal and written instruction to review signs/symptoms of diabetes, desired ranges of glucose level fasting, after meals and with exercise. Acknowledge that pre and post exercise glucose checks will be done for 3 sessions at entry of program.   Core Components/Risk Factors/Patient Goals Review:   Goals and Risk Factor Review     Row Name 02/06/23 1128 02/27/23 1140           Core Components/Risk Factors/Patient Goals Review   Personal Goals Review Tobacco Cessation;Hypertension Tobacco Cessation;Hypertension      Review Corey Barnes reports that he is still tobacco free.  He also stated that he takes all his BP meds and that he does have a wrist blood pressure monitor at home that he uses to monitor is BP. He had an elevated reading a while ago and ended up in the ED and has a follow up with his cardiologist tomorrow from this ED vist. He was encouraged to bring in his wrist monitor to class to have [place] check its accuacy. He reports he is still tobacco free. Commended him on this goal. He is taking his medications, but misplaced his wrist BP monitor. spoke with him  about looking for it and if found bringing to Rehab to measure against ours to ensure accuracy. Met with his cardiolgist, which he reports went well and that Dr is happy with his progress, will have his cholesterol labs checked next cardiolist appointment      Expected Outcomes Short: bring in home BP cuff to have it checked for accuacy. Long: continue to monitor BP at home and maintain tobacco free status. STG: find BP cuff and bring in, take meds and communicate with medical team as needed. LTG: Check BP at home and maintain tobacco free lifestyle               Core Components/Risk Factors/Patient Goals at Discharge (Final Review):   Goals and Risk Factor Review - 02/27/23 1140       Core Components/Risk Factors/Patient Goals Review   Personal Goals Review Tobacco Cessation;Hypertension    Review He reports he is still tobacco free. Commended him on this goal. He is taking his medications, but misplaced his wrist BP monitor. spoke with him about looking for it and if found bringing to Rehab to measure against ours to ensure accuracy. Met with his cardiolgist, which he reports went well and that Dr is happy with his progress, will have his cholesterol labs checked next cardiolist appointment    Expected Outcomes STG: find BP cuff and bring in, take meds and communicate with medical team as needed. LTG: Check BP at home and maintain tobacco free lifestyle             ITP Comments:  ITP Comments     Row Name 01/12/23 1102 01/16/23 1551 01/18/23 0942 01/18/23 1134 02/15/23 1250   ITP Comments Virtual orientation call completed today. he has an appointment on Date: 01/16/2023  for EP eval and gym Orientation.  Documentation of diagnosis can be found in [facility] Date: 10/30/204 . Completed and gym orientation. Initial ITP created and sent for review to Dr. Bethann Punches, Medical Director. 30 Day review completed. Medical Director ITP review done, changes made as directed, and signed approval by  Medical Director.   new to program First full day of exercise!  Patient was oriented to gym and equipment including functions, settings, policies, and procedures.  Patient's individual exercise prescription and treatment plan were reviewed.  All starting workloads were established based on the results of the 6 minute walk test done at initial orientation visit.  The plan for exercise progression was also introduced and progression will be customized based on patient's performance and goals. 30 Day review completed. Medical Director ITP review done, changes made as directed, and signed approval by Medical Director.    new to program    Row Name 03/15/23 0731           ITP Comments 30 Day review completed. Medical Director ITP review done, changes made as directed, and signed approval by Medical Director.  Comments:

## 2023-03-15 NOTE — Progress Notes (Signed)
Daily Session Note  Patient Details  Name: Corey Barnes MRN: [id number] Date of Birth: January 26, 1971 Referring Provider:   Flowsheet Row Cardiac Rehab from 01/16/2023 in [facility] Cardiac and Pulmonary Rehab  Referring Provider Dr. Massie Maroon, MD       Encounter Date: 03/15/2023  Check In:  Session Check In - 03/15/23 1128       Check-In   Supervising physician immediately available to respond to emergencies See telemetry face sheet for immediately available ER MD    Location [facility]-Cardiac & Pulmonary Rehab    Staff Present Susann Givens RN,BSN;[facility] Best, MS, Exercise Physiologist    Virtual Visit No    Medication changes reported     No    Fall or balance concerns reported    No    Warm-up and Cool-down Performed on first and last piece of equipment    Resistance Training Performed Yes    VAD Patient? No    PAD/SET Patient? No      Pain Assessment   Currently in Pain? No/denies                Social History   Tobacco Use  Smoking Status Former   Current packs/day: 0.50   Average packs/day: 0.5 packs/day for 32.0 years (16.0 ttl pk-yrs)   Types: Cigarettes  Smokeless Tobacco Never    Goals Met:  Independence with exercise equipment Exercise tolerated well No report of concerns or symptoms today Strength training completed today  Goals Unmet:  Not Applicable  Comments: Pt able to follow exercise prescription today without complaint.  Will continue to monitor for progression.    Dr. Bethann Punches is Medical Director for [facility] Cardiac Rehabilitation.  Dr. Vida Rigger is Medical Director for [facility] Pulmonary Rehabilitation.

## 2023-03-20 ENCOUNTER — Encounter: Payer: Medicaid Other | Admitting: *Deleted

## 2023-03-20 DIAGNOSIS — Z48812 Encounter for surgical aftercare following surgery on the circulatory system: Secondary | ICD-10-CM | POA: Diagnosis not present

## 2023-03-20 DIAGNOSIS — Z951 Presence of aortocoronary bypass graft: Secondary | ICD-10-CM

## 2023-03-20 NOTE — Progress Notes (Signed)
 Daily Session Note  Patient Details  Name: Corey Barnes MRN: [id number] Date of Birth: Jan 29, 1971 Referring Provider:   Flowsheet Row Cardiac Rehab from 01/16/2023 in [facility] Cardiac and Pulmonary Rehab  Referring Provider Dr. Massie Maroon, MD       Encounter Date: 03/20/2023  Check In:  Session Check In - 03/20/23 1125       Check-In   Supervising physician immediately available to respond to emergencies See telemetry face sheet for immediately available ER MD    Location [facility]-Cardiac & Pulmonary Rehab    Staff Present Cora Collum, RN, BSN, CCRP;Meredith Jewel Baize RN,BSN;Kelly Repton BS, ACSM CEP, Exercise Physiologist;Maxon Conetta BS, Exercise Physiologist    Virtual Visit No    Medication changes reported     No    Fall or balance concerns reported    No    Warm-up and Cool-down Performed on first and last piece of equipment    Resistance Training Performed Yes    VAD Patient? No    PAD/SET Patient? No      Pain Assessment   Currently in Pain? No/denies                Social History   Tobacco Use  Smoking Status Former   Current packs/day: 0.50   Average packs/day: 0.5 packs/day for 32.0 years (16.0 ttl pk-yrs)   Types: Cigarettes  Smokeless Tobacco Never    Goals Met:  Independence with exercise equipment Exercise tolerated well No report of concerns or symptoms today  Goals Unmet:  Not Applicable  Comments: Pt able to follow exercise prescription today without complaint.  Will continue to monitor for progression.    Dr. Bethann Punches is Medical Director for [facility] Cardiac Rehabilitation.  Dr. Vida Rigger is Medical Director for [facility] Pulmonary Rehabilitation.

## 2023-03-22 ENCOUNTER — Encounter: Payer: Medicaid Other | Admitting: *Deleted

## 2023-03-22 DIAGNOSIS — Z951 Presence of aortocoronary bypass graft: Secondary | ICD-10-CM

## 2023-03-22 DIAGNOSIS — Z48812 Encounter for surgical aftercare following surgery on the circulatory system: Secondary | ICD-10-CM | POA: Diagnosis not present

## 2023-03-22 NOTE — Progress Notes (Signed)
 Daily Session Note  Patient Details  Name: ZAYAN DELVECCHIO MRN: [id number] Date of Birth: 07/14/1970 Referring Provider:   Flowsheet Row Cardiac Rehab from 01/16/2023 in [facility] Cardiac and Pulmonary Rehab  Referring Provider Dr. Massie Maroon, MD       Encounter Date: 03/22/2023  Check In:  Session Check In - 03/22/23 1122       Check-In   Supervising physician immediately available to respond to emergencies See telemetry face sheet for immediately available ER MD    Location [facility]-Cardiac & Pulmonary Rehab    Staff Present Cora Collum, RN, BSN, CCRP;Margaret Best, MS, Exercise Physiologist;Maxon Conetta BS, Exercise Physiologist;Joseph Reino Kent RCP,RRT,BSRT    Virtual Visit No    Medication changes reported     No    Fall or balance concerns reported    No    Warm-up and Cool-down Performed on first and last piece of equipment    Resistance Training Performed Yes    VAD Patient? No    PAD/SET Patient? No      Pain Assessment   Currently in Pain? No/denies                Social History   Tobacco Use  Smoking Status Former   Current packs/day: 0.50   Average packs/day: 0.5 packs/day for 32.0 years (16.0 ttl pk-yrs)   Types: Cigarettes  Smokeless Tobacco Never    Goals Met:  Independence with exercise equipment Exercise tolerated well No report of concerns or symptoms today  Goals Unmet:  Not Applicable  Comments: Pt able to follow exercise prescription today without complaint.  Will continue to monitor for progression.    Dr. Bethann Punches is Medical Director for [facility] Cardiac Rehabilitation.  Dr. Vida Rigger is Medical Director for [facility] Pulmonary Rehabilitation.

## 2023-03-27 ENCOUNTER — Encounter: Payer: Medicaid Other | Admitting: *Deleted

## 2023-03-27 DIAGNOSIS — Z951 Presence of aortocoronary bypass graft: Secondary | ICD-10-CM

## 2023-03-27 DIAGNOSIS — Z48812 Encounter for surgical aftercare following surgery on the circulatory system: Secondary | ICD-10-CM | POA: Diagnosis not present

## 2023-03-27 NOTE — Progress Notes (Signed)
 Daily Session Note  Patient Details  Name: Corey Barnes MRN: [id number] Date of Birth: [birth year] Referring Provider:   Flowsheet Row Cardiac Rehab from 01/16/2023 in [facility] Cardiac and Pulmonary Rehab  Referring Provider Dr. Massie Maroon, MD       Encounter Date: 03/27/2023  Check In:  Session Check In - 03/27/23 1118       Check-In   Supervising physician immediately available to respond to emergencies See telemetry face sheet for immediately available ER MD    Location [facility]-Cardiac & Pulmonary Rehab    Staff Present Cora Collum, RN, BSN, CCRP;Margaret Best, MS, Exercise Physiologist;Maxon Conetta BS, Exercise Physiologist;Kelly Cloretta Ned, ACSM CEP, Exercise Physiologist    Virtual Visit No    Medication changes reported     No    Fall or balance concerns reported    No    Warm-up and Cool-down Performed on first and last piece of equipment    Resistance Training Performed Yes    VAD Patient? No    PAD/SET Patient? No      Pain Assessment   Currently in Pain? No/denies                Social History   Tobacco Use  Smoking Status Former   Current packs/day: 0.50   Average packs/day: 0.5 packs/day for 32.0 years (16.0 ttl pk-yrs)   Types: Cigarettes  Smokeless Tobacco Never    Goals Met:  Independence with exercise equipment Exercise tolerated well No report of concerns or symptoms today  Goals Unmet:  Not Applicable  Comments: Pt able to follow exercise prescription today without complaint.  Will continue to monitor for progression.    Dr. Bethann Punches is Medical Director for [facility] Cardiac Rehabilitation.  Dr. Vida Rigger is Medical Director for [facility] Pulmonary Rehabilitation.

## 2023-03-29 ENCOUNTER — Encounter: Payer: Medicaid Other | Admitting: *Deleted

## 2023-03-29 DIAGNOSIS — Z951 Presence of aortocoronary bypass graft: Secondary | ICD-10-CM

## 2023-03-29 DIAGNOSIS — Z48812 Encounter for surgical aftercare following surgery on the circulatory system: Secondary | ICD-10-CM | POA: Diagnosis not present

## 2023-03-29 NOTE — Progress Notes (Signed)
 Daily Session Note  Patient Details  Name: Corey Barnes MRN: [id number] Date of Birth: 1970/02/13 Referring Provider:   Flowsheet Row Cardiac Rehab from 01/16/2023 in [facility] Cardiac and Pulmonary Rehab  Referring Provider Dr. Massie Maroon, MD       Encounter Date: 03/29/2023  Check In:  Session Check In - 03/29/23 1132       Check-In   Supervising physician immediately available to respond to emergencies See telemetry face sheet for immediately available ER MD    Location [facility]-Cardiac & Pulmonary Rehab    Staff Present Susann Givens RN,BSN;Joseph [facility] Best, MS, Exercise Physiologist    Virtual Visit No    Medication changes reported     No    Fall or balance concerns reported    No    Warm-up and Cool-down Performed on first and last piece of equipment    Resistance Training Performed Yes    VAD Patient? No      Pain Assessment   Currently in Pain? No/denies                Social History   Tobacco Use  Smoking Status Former   Current packs/day: 0.50   Average packs/day: 0.5 packs/day for 32.0 years (16.0 ttl pk-yrs)   Types: Cigarettes  Smokeless Tobacco Never    Goals Met:  Independence with exercise equipment Exercise tolerated well No report of concerns or symptoms today Strength training completed today  Goals Unmet:  Not Applicable  Comments: Pt able to follow exercise prescription today without complaint.  Will continue to monitor for progression.    Dr. Bethann Punches is Medical Director for [facility] Cardiac Rehabilitation.  Dr. Vida Rigger is Medical Director for [facility] Pulmonary Rehabilitation.

## 2023-04-03 ENCOUNTER — Encounter: Payer: Medicaid Other | Attending: Cardiology | Admitting: *Deleted

## 2023-04-03 DIAGNOSIS — Z48812 Encounter for surgical aftercare following surgery on the circulatory system: Secondary | ICD-10-CM | POA: Diagnosis present

## 2023-04-03 DIAGNOSIS — Z951 Presence of aortocoronary bypass graft: Secondary | ICD-10-CM | POA: Diagnosis present

## 2023-04-03 NOTE — Progress Notes (Signed)
 Daily Session Note  Patient Details  Name: ARGUS CARAHER MRN: [id number] Date of Birth: [birth year] Referring Provider:   Flowsheet Row Cardiac Rehab from 01/16/2023 in [facility] Cardiac and Pulmonary Rehab  Referring Provider Dr. Massie Maroon, MD       Encounter Date: 04/03/2023  Check In:  Session Check In - 04/03/23 1126       Check-In   Supervising physician immediately available to respond to emergencies See telemetry face sheet for immediately available ER MD    Location [facility]-Cardiac & Pulmonary Rehab    Staff Present Elige Ko RCP,RRT,BSRT;Akbar Sacra Jewel Baize RN,BSN;Kelly Madilyn Fireman BS, ACSM CEP, Exercise Physiologist;Margaret Best, MS, Exercise Physiologist    Virtual Visit No    Medication changes reported     No    Fall or balance concerns reported    No    Warm-up and Cool-down Performed on first and last piece of equipment    Resistance Training Performed Yes    VAD Patient? No    PAD/SET Patient? No      Pain Assessment   Currently in Pain? No/denies                Social History   Tobacco Use  Smoking Status Former   Current packs/day: 0.50   Average packs/day: 0.5 packs/day for 32.0 years (16.0 ttl pk-yrs)   Types: Cigarettes  Smokeless Tobacco Never    Goals Met:  Independence with exercise equipment Exercise tolerated well  Goals Unmet:  Not Applicable  Comments: Pt able to follow exercise prescription today without complaint.  Will continue to monitor for progression.    Dr. Bethann Punches is Medical Director for [facility] Cardiac Rehabilitation.  Dr. Vida Rigger is Medical Director for [facility] Pulmonary Rehabilitation.

## 2023-04-05 ENCOUNTER — Encounter: Payer: Medicaid Other | Admitting: *Deleted

## 2023-04-05 DIAGNOSIS — Z48812 Encounter for surgical aftercare following surgery on the circulatory system: Secondary | ICD-10-CM | POA: Diagnosis not present

## 2023-04-05 DIAGNOSIS — Z951 Presence of aortocoronary bypass graft: Secondary | ICD-10-CM

## 2023-04-05 NOTE — Progress Notes (Signed)
 Daily Session Note  Patient Details  Name: Corey Barnes MRN: [id number] Date of Birth: [birth year] Referring Provider:   Flowsheet Row Cardiac Rehab from 01/16/2023 in [facility] Cardiac and Pulmonary Rehab  Referring Provider Dr. Massie Maroon, MD       Encounter Date: 04/05/2023  Check In:  Session Check In - 04/05/23 1115       Check-In   Supervising physician immediately available to respond to emergencies See telemetry face sheet for immediately available ER MD    Location [facility]-Cardiac & Pulmonary Rehab    Staff Present Cora Collum, RN, BSN, CCRP;Margaret Best, MS, Exercise Physiologist;Joseph Hood RCP,RRT,BSRT;Noah Tickle, BS, Exercise Physiologist    Virtual Visit No    Medication changes reported     No    Fall or balance concerns reported    No    Warm-up and Cool-down Performed on first and last piece of equipment    Resistance Training Performed Yes    VAD Patient? No    PAD/SET Patient? No      Pain Assessment   Currently in Pain? No/denies                Social History   Tobacco Use  Smoking Status Former   Current packs/day: 0.50   Average packs/day: 0.5 packs/day for 32.0 years (16.0 ttl pk-yrs)   Types: Cigarettes  Smokeless Tobacco Never    Goals Met:  Independence with exercise equipment Exercise tolerated well No report of concerns or symptoms today  Goals Unmet:  Not Applicable  Comments: Pt able to follow exercise prescription today without complaint.  Will continue to monitor for progression.    Dr. Bethann Punches is Medical Director for [facility] Cardiac Rehabilitation.  Dr. Vida Rigger is Medical Director for [facility] Pulmonary Rehabilitation.

## 2023-04-10 ENCOUNTER — Encounter: Payer: Medicaid Other | Admitting: *Deleted

## 2023-04-10 DIAGNOSIS — Z48812 Encounter for surgical aftercare following surgery on the circulatory system: Secondary | ICD-10-CM | POA: Diagnosis not present

## 2023-04-10 DIAGNOSIS — Z951 Presence of aortocoronary bypass graft: Secondary | ICD-10-CM

## 2023-04-10 NOTE — Progress Notes (Signed)
 Daily Session Note  Patient Details  Name: Corey Barnes MRN: [id number] Date of Birth: 1970-05-27 Referring Provider:   Flowsheet Row Cardiac Rehab from 01/16/2023 in [facility] Cardiac and Pulmonary Rehab  Referring Provider Dr. Massie Maroon, MD       Encounter Date: 04/10/2023  Check In:  Session Check In - 04/10/23 1130       Check-In   Supervising physician immediately available to respond to emergencies See telemetry face sheet for immediately available ER MD    Location [facility]-Cardiac & Pulmonary Rehab    Staff Present Cora Collum, RN, BSN, CCRP;Margaret Best, MS, Exercise Physiologist;Meredith Jewel Baize RN,BSN;Kelly Madilyn Fireman BS, ACSM CEP, Exercise Physiologist    Virtual Visit No    Medication changes reported     No    Fall or balance concerns reported    No    Warm-up and Cool-down Performed on first and last piece of equipment    Resistance Training Performed Yes    VAD Patient? No    PAD/SET Patient? No      Pain Assessment   Currently in Pain? No/denies                Social History   Tobacco Use  Smoking Status Former   Current packs/day: 0.50   Average packs/day: 0.5 packs/day for 32.0 years (16.0 ttl pk-yrs)   Types: Cigarettes  Smokeless Tobacco Never    Goals Met:  Independence with exercise equipment Exercise tolerated well No report of concerns or symptoms today  Goals Unmet:  Not Applicable  Comments: Pt able to follow exercise prescription today without complaint.  Will continue to monitor for progression.    Dr. Bethann Punches is Medical Director for [facility] Cardiac Rehabilitation.  Dr. Vida Rigger is Medical Director for [facility] Pulmonary Rehabilitation.

## 2023-04-12 ENCOUNTER — Encounter: Payer: Medicaid Other | Admitting: *Deleted

## 2023-04-12 ENCOUNTER — Encounter: Payer: Self-pay | Admitting: *Deleted

## 2023-04-12 DIAGNOSIS — Z951 Presence of aortocoronary bypass graft: Secondary | ICD-10-CM

## 2023-04-12 DIAGNOSIS — Z48812 Encounter for surgical aftercare following surgery on the circulatory system: Secondary | ICD-10-CM | POA: Diagnosis not present

## 2023-04-12 NOTE — Progress Notes (Signed)
 Daily Session Note  Patient Details  Name: Corey Barnes MRN: [id number] Date of Birth: [birth year] Referring Provider:   Flowsheet Row Cardiac Rehab from 01/16/2023 in [facility] Cardiac and Pulmonary Rehab  Referring Provider Dr. Massie Maroon, MD       Encounter Date: 04/12/2023  Check In:  Session Check In - 04/12/23 1137       Check-In   Supervising physician immediately available to respond to emergencies See telemetry face sheet for immediately available ER MD    Location [facility]-Cardiac & Pulmonary Rehab    Staff Present Cora Collum, RN, BSN, CCRP;Maxon Conetta BS, Exercise Physiologist;Joseph Hood RCP,RRT,BSRT;Noah Tickle, BS, Exercise Physiologist    Virtual Visit No    Medication changes reported     No    Fall or balance concerns reported    No    Warm-up and Cool-down Performed on first and last piece of equipment    Resistance Training Performed Yes    VAD Patient? No    PAD/SET Patient? No      Pain Assessment   Currently in Pain? No/denies                Social History   Tobacco Use  Smoking Status Former   Current packs/day: 0.50   Average packs/day: 0.5 packs/day for 32.0 years (16.0 ttl pk-yrs)   Types: Cigarettes  Smokeless Tobacco Never    Goals Met:  Independence with exercise equipment Exercise tolerated well No report of concerns or symptoms today  Goals Unmet:  Not Applicable  Comments: Pt able to follow exercise prescription today without complaint.  Will continue to monitor for progression.    Dr. Bethann Punches is Medical Director for [facility] Cardiac Rehabilitation.  Dr. Vida Rigger is Medical Director for [facility] Pulmonary Rehabilitation.

## 2023-04-12 NOTE — Progress Notes (Signed)
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 Therapy Plan and Nutrition Goals:  Nutrition Therapy & Goals - 01/18/23 1526       Nutrition Therapy   Diet Cardiac, Low Na    Protein (specify units) 90    Fiber 30 grams    Whole Grain Foods 3 servings    Saturated Fats 15 max. grams    Fruits and Vegetables 5 servings/day    Sodium 2 grams      Personal Nutrition Goals   Nutrition Goal Eat 15-30gProtein and 30-60gCarbs at each meal.    Personal Goal #2 Read labels and reduce sodium intake to below 2300mg . Ideally 1500mg  per day.  Personal Goal #3 Reduce saturated fat, less than 12g per day. Replace bad fats for more heart healthy fats.    Comments Patient drinking mostly water, ~48-64oz daily. 1 can of soda per day. He has been gaining weight since he has become less physically active. He is trying to lose some weight with diet. He reports he rarely eats breakfast or lunch and will usually eat dinner. Spoke with him about the importance  of spreading his calories and protein intake with fiber and healthy fats throughout the day. Encouraged him to log on myfitnesspal for a free resource to assess if he is under eating. Reviewed Mediterranean diet handout, educated on types of fats, sources, and how to read labels. Spoke about cutting back on sodium, he has already stopped using salt for cooking and seasoning. Reminded him to read labels as well, since much of sodium intake can come from processed and packaged foods. Built out several meals and snacks with foods he likes and will eat focusing on nutrient dense foods that will help him meet his protein, fiber, and calorie goals while keeping quantity of food low.      Intervention Plan   Intervention Prescribe, educate and counsel regarding individualized specific dietary modifications aiming towards targeted core components such as weight, hypertension, lipid management, diabetes, heart failure and other comorbidities.;Nutrition handout(s) given to patient.    Expected Outcomes Short Term Goal: Understand basic principles of dietary content, such as calories, fat, sodium, cholesterol and nutrients.;Short Term Goal: A plan has been developed with personal nutrition goals set during dietitian appointment.;Long Term Goal: Adherence to prescribed nutrition plan.             Nutrition Assessments:  MEDIFICTS Score Key: >=70 Need to make dietary changes  40-70 Heart Healthy Diet <= 40 Therapeutic Level Cholesterol Diet  Flowsheet Row Cardiac Rehab from 01/12/2023 in [facility] Cardiac and Pulmonary Rehab  Picture Your Plate Total Score on Admission 59      Picture Your Plate Scores: <41 Unhealthy dietary pattern with much room for improvement. 41-50 Dietary pattern unlikely to meet recommendations for good health and room for improvement. 51-60 More healthful dietary pattern, with some room for improvement.  >60 Healthy dietary pattern, although there may be some specific behaviors  that could be improved.    Nutrition Goals Re-Evaluation:  Nutrition Goals Re-Evaluation     Row Name 02/06/23 1131 02/27/23 1138 04/10/23 1136         Goals   Nutrition Goal Eat 15-30gProtein and 30-60gCarbs at each meal. -- --     Comment [name] reports he has had a hard time working on nutrition goals due to limited income and what he can afford to buy. He states that he tries his best to incorporate fruits, vegetables, and protein when he can afford it. He reports he has done better eating more food throughout the day and not just at one meal. He is reading labels and trying to limit sodium and saturated fat. Discussed some easy cheaper meal ideas to try and help him include more food without exceeding sodium and saturated fat limits. [name] is trying to continue to adhere to the guidelines set by the RD. He has been gaining weight but still only eating his one big meal daily, so he is thinking about mentioning that to his doctor. He is trying to stick to low sodium options. He has decreased his caffeine intake to one soda most days     Expected Outcome Short: be  mindful of what he is eating and try to encorporate fresh foods when budget allows. Try to work on reading food labels.  Long: be able to maintain heart healthy diet as budget allows. STG: eat 2-3 meals per day , include healthy fats and low sodium frozen meals id needed. LTG: maintain heart healthy lifestyle changes Short: look at options for meal planning with doctor. Long: independently manage heart healthy diet       Personal Goal #2 Re-Evaluation   Personal Goal #2 Read labels and reduce sodium intake to below 2300mg . Ideally 1500mg  per day. -- --       Personal Goal #3 Re-Evaluation   Personal Goal #3 Reduce saturated fat, less than 12g per day. Replace bad fats for more heart healthy fats. -- --              Nutrition Goals Discharge (Final Nutrition Goals Re-Evaluation):  Nutrition Goals Re-Evaluation - 04/10/23 1136        Goals   Comment [name] is trying to continue to adhere to the guidelines set by the RD. He has been gaining weight but still only eating his one big meal daily, so he is thinking about mentioning that to his doctor. He is trying to stick to low sodium options. He has decreased his caffeine intake to one soda most days    Expected Outcome Short: look at options for meal planning with doctor. Long: independently manage heart healthy diet             Psychosocial: Target Goals: Acknowledge presence or absence of significant depression and/or stress, maximize coping skills, provide positive support system. Participant is able to verbalize types and ability to use techniques and skills needed for reducing stress and depression.   Education: Stress, Anxiety, and Depression - Group verbal and visual presentation to define topics covered.  Reviews how body is impacted by stress, anxiety, and depression.  Also discusses healthy ways to reduce stress and to treat/manage anxiety and depression.  Written material given at graduation.   Education: Sleep Hygiene -Provides group verbal and written instruction about how sleep can affect your health.  Define sleep hygiene, discuss sleep cycles and impact of sleep habits. Review good sleep hygiene tips.    Initial Review & Psychosocial Screening:  Initial Psych Review & Screening - 01/12/23 1050       Initial Review   Current issues with Current Anxiety/Panic;Current Stress Concerns    Source of Stress Concerns Financial    Comments out of work  a year working on disability.   prescibed meds for anxiety.  is working on trting to redirect thoughts to decrease anxiety      Family Dynamics   Good Support System? Yes   oldest kid, Mom, some friends     Barriers   Psychosocial barriers to participate in program There are no identifiable barriers or psychosocial needs.      Screening Interventions   Interventions Encouraged to exercise     Expected Outcomes Short Term goal: Utilizing psychosocial counselor, staff and physician to assist with identification of specific Stressors or current issues interfering with healing process. Setting desired goal for each stressor or current issue identified.;Long Term Goal: Stressors or current issues are controlled or eliminated.;Short Term goal: Identification and review with participant of any Quality of Life or Depression concerns found by scoring the questionnaire.;Long Term goal: The participant improves quality of Life and PHQ9 Scores as seen by post scores and/or verbalization of changes  Quality of Life Scores:   Quality of Life - 01/12/23 1115       Quality of Life   Select Quality of Life      Quality of Life Scores   Health/Function Pre 12.94 %    Socioeconomic Pre 11.13 %    Psych/Spiritual Pre 13.75 %    Family Pre 13.64 %    GLOBAL Pre 16.1 %            Scores of 19 and below usually indicate a poorer quality of life in these areas.  A difference of  2-3 points is a clinically meaningful difference.  A difference of 2-3 points in the total score of the Quality of Life Index has been associated with significant improvement in overall quality of life, self-image, physical symptoms, and general health in studies assessing change in quality of life.  PHQ-9: Review Flowsheet       04/10/2023 02/27/2023 02/06/2023 01/16/2023  Depression screen PHQ 2/9  Decreased Interest 2 1 2 3   Down, Depressed, Hopeless 1 1 1 2   PHQ - 2 Score 3 2 3 5   Altered sleeping 3 3 3 3   Tired, decreased energy 1 - 2 2  Change in appetite 0 0 2 2  Feeling bad or failure about yourself  2 2 3 1   Trouble concentrating 1 1 1 2   Moving slowly or fidgety/restless 1 0 0 0  Suicidal thoughts 0 0 0 0  PHQ-9 Score 11 8 14 15   Difficult doing work/chores Somewhat difficult Somewhat difficult Somewhat difficult Somewhat difficult   Interpretation of Total Score  Total Score Depression  Severity:  1-4 = Minimal depression, 5-9 = Mild depression, 10-14 = Moderate depression, 15-19 = Moderately severe depression, 20-27 = Severe depression   Psychosocial Evaluation and Intervention:  Psychosocial Evaluation - 01/12/23 1109       Psychosocial Evaluation & Interventions   Interventions Encouraged to exercise with the program and follow exercise prescription;Relaxation education    Comments There are no barriers to attending the program. His Mom lives with him. She and his older son and smoe friends are his support.  He does have a history of anxiety and has meds to help as needed. He stated that he tries to think about other things to decrease the anxiety. He does have finacial concern as he has been out of work about a year. He is working with a Clinical research associate to get diasability.  He is ready to start the program and work on feeling better    Expected Outcomes STG attend all sessions as scheduled, continue to work on methods to decrease anxiety response.  LTG able to manage anxiety symptoms    Continue Psychosocial Services  Follow up required by staff             Psychosocial Re-Evaluation:  Psychosocial Re-Evaluation     Row Name 02/06/23 1138 02/27/23 1134 04/10/23 1130         Psychosocial Re-Evaluation   Current issues with Current Anxiety/Panic;Current Stress Concerns Current Anxiety/Panic;Current Stress Concerns;Current Sleep Concerns Current Stress Concerns     Comments Patient reevaluated with another PHQ questionaire. His score went down from a 15 to a 14. He struggles with the fact that most of his hobbies require being physical and having mobility, with which he is limiited. He also has financial stressors currently. Continues with prescribed treatment. Reevaluated PHQ questionaire, he improved from 14 to 8. He still struggles with losing hobbies due to cost of  disability. Struggles to sleep most nights, reports anxiety is a big barrier to getting to sleep. He take his  meds and has a psych evaluation coming up in a month or so. PHQ went from 8 to 11 this check. He mentions he just has been feeling more down these last few weeks, but mentions he spent time outside yesterday for the first time in a while which made him feel better. After his knee injury at work in 2018, he mentions that he now can't do any of his hobbies he used to enjoy. We discussed swimming or water aerobics and he is going to think about trying that. His kids are his main support system and continue to help keep him motivated to work on his health. He doesn't really feel a difference with his mental health since starting the program, but notes that he wants to continue exercising after he graduates because he knows its good for him and will feel the benefit eventually. He notes he feels more jittery than usual and is going to talk to his doctor about a refill on his anxiety medications. He has had his physical to determine his disability standing and is waiting for the results for his disability coverage     Expected Outcomes Short: continue to exercise to try to gain strength and mobility so he can start doing hobbies he previously enjoyed. Long: maintian good mental health habits. STG: focus on improving strength, take meds as prescribed. LTG: achieve and maintain positive outlook on health and daily life Short: talk with doctor about anxiety medication Long: find hobbies that he is able to do with his knee injury     Interventions Encouraged to attend Cardiac Rehabilitation for the exercise Encouraged to attend Cardiac Rehabilitation for the exercise Stress management education;Relaxation education     Continue Psychosocial Services  Follow up required by staff Follow up required by staff Follow up required by staff       Initial Review   Source of Stress Concerns -- -- Unable to perform yard/household activities              Psychosocial Discharge (Final Psychosocial Re-Evaluation):   Psychosocial Re-Evaluation - 04/10/23 1130       Psychosocial Re-Evaluation   Current issues with Current Stress Concerns    Comments PHQ went from 8 to 11 this check. He mentions he just has been feeling more down these last few weeks, but mentions he spent time outside yesterday for the first time in a while which made him feel better. After his knee injury at work in 2018, he mentions that he now can't do any of his hobbies he used to enjoy. We discussed swimming or water aerobics and he is going to think about trying that. His kids are his main support system and continue to help keep him motivated to work on his health. He doesn't really feel a difference with his mental health since starting the program, but notes that he wants to continue exercising after he graduates because he knows its good for him and will feel the benefit eventually. He notes he feels more jittery than usual and is going to talk to his doctor about a refill on his anxiety medications. He has had his physical to determine his disability standing and is waiting for the results for his disability coverage    Expected Outcomes Short: talk with doctor about anxiety medication Long: find hobbies that he is able to do with his knee injury  Interventions Stress management education;Relaxation education    Continue Psychosocial Services  Follow up required by staff      Initial Review   Source of Stress Concerns Unable to perform yard/household activities             Vocational Rehabilitation: Provide vocational rehab assistance to qualifying candidates.   Vocational Rehab Evaluation & Intervention:  Vocational Rehab - 04/10/23 1130       Initial Vocational Rehab Evaluation & Intervention   Assessment shows need for Vocational Rehabilitation No             Education: Education Goals: Education classes will be provided on a variety of topics geared toward better understanding of heart health and risk factor  modification. Participant will state understanding/return demonstration of topics presented as noted by education test scores.  Learning Barriers/Preferences:   General Cardiac Education Topics:  AED/CPR: - Group verbal and written instruction with the use of models to demonstrate the basic use of the AED with the basic ABC's of resuscitation.   Anatomy and Cardiac Procedures: - Group verbal and visual presentation and models provide information about basic cardiac anatomy and function. Reviews the testing methods done to diagnose heart disease and the outcomes of the test results. Describes the treatment choices: Medical Management, Angioplasty, or Coronary Bypass Surgery for treating various heart conditions including Myocardial Infarction, Angina, Valve Disease, and Cardiac Arrhythmias.  Written material given at graduation.   Medication Safety: - Group verbal and visual instruction to review commonly prescribed medications for heart and lung disease. Reviews the medication, class of the drug, and side effects. Includes the steps to properly store meds and maintain the prescription regimen.  Written material given at graduation.   Intimacy: - Group verbal instruction through game format to discuss how heart and lung disease can affect sexual intimacy. Written material given at graduation..   Know Your Numbers and Heart Failure: - Group verbal and visual instruction to discuss disease risk factors for cardiac and pulmonary disease and treatment options.  Reviews associated critical values for Overweight/Obesity, Hypertension, Cholesterol, and Diabetes.  Discusses basics of heart failure: signs/symptoms and treatments.  Introduces Heart Failure Zone chart for action plan for heart failure.  Written material given at graduation. Flowsheet Row Cardiac Rehab from 01/16/2023 in [facility] Cardiac and Pulmonary Rehab  Education need identified 01/16/23       Infection Prevention: - Provides  verbal and written material to individual with discussion of infection control including proper hand washing and proper equipment cleaning during exercise session. Flowsheet Row Cardiac Rehab from 01/16/2023 in [facility] Cardiac and Pulmonary Rehab  Date 01/16/23  Educator NT  Instruction Review Code 1- Verbalizes Understanding       Falls Prevention: - Provides verbal and written material to individual with discussion of falls prevention and safety. Flowsheet Row Cardiac Rehab from 01/16/2023 in [facility] Cardiac and Pulmonary Rehab  Date 01/12/23  Educator SB  Instruction Review Code 1- Verbalizes Understanding       Other: -Provides group and verbal instruction on various topics (see comments)   Knowledge Questionnaire Score:  Knowledge Questionnaire Score - 01/12/23 1117       Knowledge Questionnaire Score   Pre Score 23/26  missed NTG, nutrition and exercise             Core Components/Risk Factors/Patient Goals at Admission:  Personal Goals and Risk Factors at Admission - 01/12/23 1138       Core Components/Risk Factors/Patient Goals on Admission  Number of packs per day [name] is a former tobacco user. Intervention for tobacco cessation was provided at the initial medical review. He was asked about when he  quit and reported 11/14/2022 right before his surgery . Patient was advised and educated about tobacco cessation using combination therapy, tobacco cessation classes, quit line, and quit smoking apps. Patient demonstrated understanding of this material. Staff will continue to provide encouragement and follow up with the patient throughout the progra             Education:Diabetes - Individual verbal and written instruction to review signs/symptoms of diabetes, desired ranges of glucose level fasting, after meals and with exercise. Acknowledge that pre and post exercise glucose checks will be done for 3 sessions at entry of program.   Core Components/Risk  Factors/Patient Goals Review:   Goals and Risk Factor Review     Row Name 02/06/23 1128 02/27/23 1140 04/10/23 1124         Core Components/Risk Factors/Patient Goals Review   Personal Goals Review Tobacco Cessation;Hypertension Tobacco Cessation;Hypertension Weight Management/Obesity;Hypertension;Lipids     Review [name] reports that he is still tobacco free. He also stated that he takes all his BP meds and that he does have a wrist blood pressure monitor at home that he uses to monitor is BP. He had an elevated reading a while ago and ended up in the ED and has a follow up with his cardiologist tomorrow from this ED vist. He was encouraged to bring in his wrist monitor to class to have [place] check its accuacy. He reports he is still tobacco free. Commended him on this goal. He is taking his medications, but misplaced his wrist BP monitor. spoke with him about looking for it and if found bringing to Rehab to measure against ours to ensure accuracy. Met with his cardiolgist, which he reports went well and that Dr is happy with his progress, will have his cholesterol labs checked next cardiolist appointment [name] reports he has been enjoying the program so far. His blood pressure readings at home have been higher than in the program, so he is planning on bringing his home bp monitor in so we can compare readings. He has been tolerating the repatha well which pleases him since he can't tolerate statins. He will have his cholesterol labs checked in August at his next appointment. He is concerned that he is still gaining weight even though he is not eating more food. Discussed signs of fluid retention and so far he is not having any. He is thinking about having a conversation with his doctor related to his weight.     Expected Outcomes Short: bring in home BP cuff to have it checked for accuacy. Long: continue to monitor BP at home and maintain tobacco free status. STG: find BP cuff and bring in, take meds and  communicate with medical team as needed. LTG: Check BP at home and maintain tobacco free lifestyle Short: bring in bp cuff to check accuracy and continue to work on cholesterol management. Long: independenlty manage risk factors              Core Components/Risk Factors/Patient Goals at Discharge (Final Review):   Goals and Risk Factor Review - 04/10/23 1124       Core Components/Risk Factors/Patient Goals Review   Personal Goals Review Weight Management/Obesity;Hypertension;Lipids    Review [name] reports he has been enjoying the program so far. His blood pressure readings at home have been higher than in  the program, so he is planning on bringing his home bp monitor in so we can compare readings. He has been tolerating the repatha well which pleases him since he can't tolerate statins. He will have his cholesterol labs checked in August at his next appointment. He is concerned that he is still gaining weight even though he is not eating more food. Discussed signs of fluid retention and so far he is not having any. He is thinking about having a conversation with his doctor related to his weight.    Expected Outcomes Short: bring in bp cuff to check accuracy and continue to work on cholesterol management. Long: independenlty manage risk factors             ITP Comments:  ITP Comments     Row Name 01/12/23 1102 01/16/23 1551 01/18/23 0942 01/18/23 1134 02/15/23 1250   ITP Comments Virtual orientation call completed today. he has an appointment on Date: 01/16/2023  for EP eval and gym Orientation.  Documentation of diagnosis can be found in [facility] Date: 10/30/204 . Completed and gym orientation. Initial ITP created and sent for review to Dr. Bethann Punches, Medical Director. 30 Day review completed. Medical Director ITP review done, changes made as directed, and signed approval by Medical Director.   new to program First full day of exercise!  Patient was oriented to gym and equipment  including functions, settings, policies, and procedures.  Patient's individual exercise prescription and treatment plan were reviewed.  All starting workloads were established based on the results of the 6 minute walk test done at initial orientation visit.  The plan for exercise progression was also introduced and progression will be customized based on patient's performance and goals. 30 Day review completed. Medical Director ITP review done, changes made as directed, and signed approval by Medical Director.    new to program    Row Name 03/15/23 0731 04/12/23 0850         ITP Comments 30 Day review completed. Medical Director ITP review done, changes made as directed, and signed approval by Medical Director. 30 Day review completed. Medical Director ITP review done, changes made as directed, and signed approval by Medical Director.               Comments:

## 2023-04-17 ENCOUNTER — Encounter: Payer: Medicaid Other | Admitting: *Deleted

## 2023-04-17 DIAGNOSIS — Z48812 Encounter for surgical aftercare following surgery on the circulatory system: Secondary | ICD-10-CM | POA: Diagnosis not present

## 2023-04-17 DIAGNOSIS — Z951 Presence of aortocoronary bypass graft: Secondary | ICD-10-CM

## 2023-04-17 NOTE — Progress Notes (Signed)
 Daily Session Note  Patient Details  Name: RAJESH WYSS MRN: [id number] Date of Birth: February 26, 1970 Referring Provider:   Flowsheet Row Cardiac Rehab from 01/16/2023 in [facility] Cardiac and Pulmonary Rehab  Referring Provider Dr. Massie Maroon, MD       Encounter Date: 04/17/2023  Check In:  Session Check In - 04/17/23 1135       Check-In   Supervising physician immediately available to respond to emergencies See telemetry face sheet for immediately available ER MD    Location [facility]-Cardiac & Pulmonary Rehab    Staff Present Rory Percy, MS, Exercise Physiologist;Landon Bassford, RN, BSN, CCRP;Meredith Jewel Baize RN,BSN;Kelly [employer], ACSM CEP, Exercise Physiologist    Virtual Visit No    Medication changes reported     No    Fall or balance concerns reported    No    Warm-up and Cool-down Performed on first and last piece of equipment    Resistance Training Performed Yes    VAD Patient? No    PAD/SET Patient? No      Pain Assessment   Currently in Pain? No/denies                Social History   Tobacco Use  Smoking Status Former   Current packs/day: 0.50   Average packs/day: 0.5 packs/day for 32.0 years (16.0 ttl pk-yrs)   Types: Cigarettes  Smokeless Tobacco Never    Goals Met:  Independence with exercise equipment Exercise tolerated well No report of concerns or symptoms today  Goals Unmet:  Not Applicable  Comments: Pt able to follow exercise prescription today without complaint.  Will continue to monitor for progression.    Dr. Bethann Punches is Medical Director for [facility] Cardiac Rehabilitation.  Dr. Vida Rigger is Medical Director for [facility] Pulmonary Rehabilitation.

## 2023-04-19 ENCOUNTER — Encounter: Payer: Medicaid Other | Admitting: *Deleted

## 2023-04-19 DIAGNOSIS — Z48812 Encounter for surgical aftercare following surgery on the circulatory system: Secondary | ICD-10-CM | POA: Diagnosis not present

## 2023-04-19 DIAGNOSIS — Z951 Presence of aortocoronary bypass graft: Secondary | ICD-10-CM

## 2023-04-19 NOTE — Progress Notes (Signed)
 Daily Session Note  Patient Details  Name: Corey Barnes MRN: [id number] Date of Birth: 03/06/1970 Referring Provider:   Flowsheet Row Cardiac Rehab from 01/16/2023 in [facility] Cardiac and Pulmonary Rehab  Referring Provider Dr. Massie Maroon, MD       Encounter Date: 04/19/2023  Check In:  Session Check In - 04/19/23 1207       Check-In   Supervising physician immediately available to respond to emergencies See telemetry face sheet for immediately available ER MD    Location [facility]-Cardiac & Pulmonary Rehab    Staff Present Maxon Conetta BS, Exercise Physiologist;Noah Tickle, BS, Exercise Physiologist;Glorianne Proctor, RN, BSN, CCRP;Joseph Hood RCP,RRT,BSRT    Virtual Visit No    Medication changes reported     No    Fall or balance concerns reported    No    Warm-up and Cool-down Performed on first and last piece of equipment    Resistance Training Performed Yes    VAD Patient? No    PAD/SET Patient? No      Pain Assessment   Currently in Pain? No/denies                Social History   Tobacco Use  Smoking Status Former   Current packs/day: 0.50   Average packs/day: 0.5 packs/day for 32.0 years (16.0 ttl pk-yrs)   Types: Cigarettes  Smokeless Tobacco Never    Goals Met:  Independence with exercise equipment Exercise tolerated well No report of concerns or symptoms today  Goals Unmet:  Not Applicable  Comments: Pt able to follow exercise prescription today without complaint.  Will continue to monitor for progression.    Dr. Bethann Punches is Medical Director for [facility] Cardiac Rehabilitation.  Dr. Vida Rigger is Medical Director for [facility] Pulmonary Rehabilitation.

## 2023-05-01 ENCOUNTER — Encounter: Admitting: *Deleted

## 2023-05-01 ENCOUNTER — Ambulatory Visit: Payer: Medicaid Other

## 2023-05-01 DIAGNOSIS — Z48812 Encounter for surgical aftercare following surgery on the circulatory system: Secondary | ICD-10-CM | POA: Diagnosis not present

## 2023-05-01 DIAGNOSIS — Z951 Presence of aortocoronary bypass graft: Secondary | ICD-10-CM

## 2023-05-01 NOTE — Progress Notes (Signed)
 Daily Session Note  Patient Details  Name: Corey Barnes MRN: [id number] Date of Birth: May 18, 1970 Referring Provider:   Flowsheet Row Cardiac Rehab from 01/16/2023 in [facility] Cardiac and Pulmonary Rehab  Referring Provider Dr. Massie Maroon, MD       Encounter Date: 05/01/2023  Check In:  Session Check In - 05/01/23 1134       Check-In   Supervising physician immediately available to respond to emergencies See telemetry face sheet for immediately available ER MD    Location [facility]-Cardiac & Pulmonary Rehab    Staff Present Cora Collum, RN, BSN, CCRP;Margaret Best, MS, Exercise Physiologist;Maxon Conetta BS, Exercise Physiologist;Kelly Cloretta Ned, ACSM CEP, Exercise Physiologist    Virtual Visit No    Medication changes reported     No    Fall or balance concerns reported    No    Warm-up and Cool-down Performed on first and last piece of equipment    Resistance Training Performed Yes    VAD Patient? No    PAD/SET Patient? No      Pain Assessment   Currently in Pain? No/denies                Social History   Tobacco Use  Smoking Status Former   Current packs/day: 0.50   Average packs/day: 0.5 packs/day for 32.0 years (16.0 ttl pk-yrs)   Types: Cigarettes  Smokeless Tobacco Never    Goals Met:  Independence with exercise equipment Exercise tolerated well No report of concerns or symptoms today  Goals Unmet:  Not Applicable  Comments: Pt able to follow exercise prescription today without complaint.  Will continue to monitor for progression.    Dr. Bethann Punches is Medical Director for [facility] Cardiac Rehabilitation.  Dr. Vida Rigger is Medical Director for [facility] Pulmonary Rehabilitation.

## 2023-05-03 ENCOUNTER — Encounter: Payer: Medicaid Other | Attending: Cardiology | Admitting: *Deleted

## 2023-05-03 DIAGNOSIS — Z951 Presence of aortocoronary bypass graft: Secondary | ICD-10-CM | POA: Diagnosis present

## 2023-05-03 NOTE — Progress Notes (Signed)
 Daily Session Note  Patient Details  Name: Corey Barnes MRN: [id number] Date of Birth: Aug 16, 1970 Referring Provider:   Flowsheet Row Cardiac Rehab from 01/16/2023 in [facility] Cardiac and Pulmonary Rehab  Referring Provider Dr. Massie Maroon, MD       Encounter Date: 05/03/2023  Check In:  Session Check In - 05/03/23 1125       Check-In   Supervising physician immediately available to respond to emergencies See telemetry face sheet for immediately available ER MD    Location [facility]-Cardiac & Pulmonary Rehab    Staff Present Cora Collum, RN, BSN, CCRP;Joseph Hood RCP,RRT,BSRT;Maxon Timberlake BS, Exercise Physiologist;Noah Tickle, BS, Exercise Physiologist;Jason Wallace Cullens RDN,LDN    Virtual Visit No    Medication changes reported     No    Fall or balance concerns reported    No    Warm-up and Cool-down Performed on first and last piece of equipment    Resistance Training Performed Yes    VAD Patient? No    PAD/SET Patient? No      Pain Assessment   Currently in Pain? No/denies                Social History   Tobacco Use  Smoking Status Former   Current packs/day: 0.50   Average packs/day: 0.5 packs/day for 32.0 years (16.0 ttl pk-yrs)   Types: Cigarettes  Smokeless Tobacco Never    Goals Met:  Independence with exercise equipment Exercise tolerated well No report of concerns or symptoms today  Goals Unmet:  Not Applicable  Comments: Pt able to follow exercise prescription today without complaint.  Will continue to monitor for progression.    Dr. Bethann Punches is Medical Director for [facility] Cardiac Rehabilitation.  Dr. Vida Rigger is Medical Director for [facility] Pulmonary Rehabilitation.

## 2023-05-08 ENCOUNTER — Encounter: Payer: Medicaid Other | Admitting: *Deleted

## 2023-05-08 DIAGNOSIS — Z951 Presence of aortocoronary bypass graft: Secondary | ICD-10-CM | POA: Diagnosis not present

## 2023-05-08 NOTE — Progress Notes (Signed)
 Daily Session Note  Patient Details  Name: Corey Barnes MRN: [id number] Date of Birth: September 02, 1970 Referring Provider:   Flowsheet Row Cardiac Rehab from 01/16/2023 in [facility] Cardiac and Pulmonary Rehab  Referring Provider Dr. Massie Maroon, MD       Encounter Date: 05/08/2023  Check In:  Session Check In - 05/08/23 1124       Check-In   Supervising physician immediately available to respond to emergencies See telemetry face sheet for immediately available ER MD    Location [facility]-Cardiac & Pulmonary Rehab    Staff Present Cora Collum, RN, BSN, CCRP;Joseph Hood RCP,RRT,BSRT;Maxon [employer], Exercise Physiologist;[employer], ACSM CEP, Exercise Physiologist    Virtual Visit No    Medication changes reported     No    Fall or balance concerns reported    No    Warm-up and Cool-down Performed on first and last piece of equipment    Resistance Training Performed Yes    VAD Patient? No    PAD/SET Patient? No      Pain Assessment   Currently in Pain? No/denies                Social History   Tobacco Use  Smoking Status Former   Current packs/day: 0.50   Average packs/day: 0.5 packs/day for 32.0 years (16.0 ttl pk-yrs)   Types: Cigarettes  Smokeless Tobacco Never    Goals Met:  Independence with exercise equipment Exercise tolerated well No report of concerns or symptoms today  Goals Unmet:  Not Applicable  Comments: Pt able to follow exercise prescription today without complaint.  Will continue to monitor for progression.    Dr. Bethann Punches is Medical Director for [facility] Cardiac Rehabilitation.  Dr. Vida Rigger is Medical Director for [facility] Pulmonary Rehabilitation.

## 2023-05-10 ENCOUNTER — Encounter: Payer: Self-pay | Admitting: *Deleted

## 2023-05-10 DIAGNOSIS — Z951 Presence of aortocoronary bypass graft: Secondary | ICD-10-CM

## 2023-05-10 NOTE — Progress Notes (Signed)
 Daily Session Note  Patient Details  Name: AMADO ANDAL MRN: [id number] Date of Birth: 08-31-1970 Referring Provider:   Flowsheet Row Cardiac Rehab from 01/16/2023 in [facility] Cardiac and Pulmonary Rehab  Referring Provider Dr. Massie Maroon, MD       Encounter Date: 05/10/2023  Check In:  Session Check In - 05/10/23 1101       Check-In   Supervising physician immediately available to respond to emergencies See telemetry face sheet for immediately available ER MD    Location [facility]-Cardiac & Pulmonary Rehab    Staff Present Susann Givens RN,BSN;Margaret Best, MS, Exercise Physiologist;Glade Strausser RN,BSN,MPA;Joseph Hood RCP,RRT,BSRT    Virtual Visit No    Medication changes reported     No    Fall or balance concerns reported    No    Tobacco Cessation No Change    Warm-up and Cool-down Performed on first and last piece of equipment    Resistance Training Performed Yes    VAD Patient? No    PAD/SET Patient? No      Pain Assessment   Currently in Pain? No/denies                Social History   Tobacco Use  Smoking Status Former   Current packs/day: 0.50   Average packs/day: 0.5 packs/day for 32.0 years (16.0 ttl pk-yrs)   Types: Cigarettes  Smokeless Tobacco Never    Goals Met:  Independence with exercise equipment Exercise tolerated well No report of concerns or symptoms today Strength training completed today  Goals Unmet:  Not Applicable  Comments: Pt able to follow exercise prescription today without complaint.  Will continue to monitor for progression.    Dr. Bethann Punches is Medical Director for [facility] Cardiac Rehabilitation.  Dr. Vida Rigger is Medical Director for [facility] Pulmonary Rehabilitation.

## 2023-05-10 NOTE — Progress Notes (Signed)
 Cardiac Individual Treatment Plan  Patient Details  Name: Corey Barnes MRN: [id number] Date of Birth: October 09, 1970 Referring Provider:   Flowsheet Row Cardiac Rehab from 01/16/2023 in [facility] Cardiac and Pulmonary Rehab  Referring Provider Dr. Massie Maroon, MD       Initial Encounter Date:  Flowsheet Row Cardiac Rehab from 01/16/2023 in [facility] Cardiac and Pulmonary Rehab  Date 01/16/23       Visit Diagnosis: S/P CABG x 2  Patient's Home Medications on Admission:  Current Outpatient Medications:    acetaminophen (TYLENOL) 500 MG tablet, Take 500 mg by mouth every 8 (eight) hours as needed., Disp: , Rfl:    albuterol (PROVENTIL HFA;VENTOLIN HFA) 108 (90 Base) MCG/ACT inhaler, Inhale 2 puffs into the lungs every 6 (six) hours as needed for wheezing or shortness of breath. (Patient not taking: Reported on 01/12/2023), Disp: 1 Inhaler, Rfl: 0   aspirin EC 81 MG tablet, Take 1 tablet by mouth daily., Disp: , Rfl:    atorvastatin (LIPITOR) 20 MG tablet, Take 1 tablet (20 mg total) by mouth daily. (Patient not taking: Reported on 01/12/2023), Disp: 90 tablet, Rfl: 1   ciprofloxacin-dexamethasone (CIPRODEX) OTIC suspension, Place 4 drops into the right ear 2 (two) times daily. (Patient not taking: Reported on 01/12/2023), Disp: 7.5 mL, Rfl: 0   diltiazem (CARDIZEM CD) 120 MG 24 hr capsule, Take 1 capsule by mouth daily., Disp: , Rfl:    ezetimibe (ZETIA) 10 MG tablet, Take 10 mg by mouth daily., Disp: , Rfl:    hydrOXYzine (ATARAX) 25 MG tablet, Take 1 tablet (25 mg total) by mouth 3 (three) times daily as needed for anxiety., Disp: 20 tablet, Rfl: 0   losartan (COZAAR) 50 MG tablet, TAKE 1 TABLET BY MOUTH EVERY DAY (Patient not taking: Reported on 01/12/2023), Disp: 90 tablet, Rfl: 1   meloxicam (MOBIC) 15 MG tablet, Take 1 tablet (15 mg total) by mouth daily as needed for pain. (Patient not taking: Reported on 01/12/2023), Disp: 30 tablet, Rfl: 0   metoprolol succinate (TOPROL-XL) 25 MG 24 hr  tablet, Take 12.5 mg by mouth daily., Disp: , Rfl:    naproxen (NAPROSYN) 500 MG tablet, Take 1 tablet (500 mg total) by mouth 2 (two) times daily with a meal. (Patient not taking: Reported on 01/12/2023), Disp: 20 tablet, Rfl: 0   REPATHA SURECLICK 140 MG/ML SOAJ, Inject 140 mg into the skin every 14 (fourteen) days., Disp: , Rfl:   Past Medical History: Past Medical History:  Diagnosis Date   Coronary artery disease involving native coronary artery of native heart 09/21/2022   High cholesterol    Hypertension    Mixed hyperlipidemia 04/04/2022   Neck pain 04/04/2022   Pain of both sacroiliac joints 04/04/2022    Tobacco Use: Social History   Tobacco Use  Smoking Status Former   Current packs/day: 0.50   Average packs/day: 0.5 packs/day for 32.0 years (16.0 ttl pk-yrs)   Types: Cigarettes  Smokeless Tobacco Never    Labs: Review Flowsheet       Latest Ref Rng & Units 04/04/2022  Labs for ITP Cardiac and Pulmonary Rehab  Cholestrol 100 - 199 mg/dL 409   LDL (calc) 0 - 99 mg/dL 811   HDL-C >91 mg/dL 34   Trlycerides 0 - 478 mg/dL 295   Hemoglobin A2Z 4.8 - 5.6 % 5.7      Exercise Target Goals: Exercise Program Goal: Individual exercise prescription set using results from initial 6 min walk test and THRR while  considering  patient's activity barriers and safety.   Exercise Prescription Goal: Initial exercise prescription builds to 30-45 minutes a day of aerobic activity, 2-3 days per week.  Home exercise guidelines will be given to patient during program as part of exercise prescription that the participant will acknowledge.   Education: Aerobic Exercise: - Group verbal and visual presentation on the components of exercise prescription. Introduces F.I.T.T principle from ACSM for exercise prescriptions.  Reviews F.I.T.T. principles of aerobic exercise including progression. Written material given at graduation. Flowsheet Row Cardiac Rehab from 01/16/2023 in [facility] Cardiac  and Pulmonary Rehab  Education need identified 01/16/23       Education: Resistance Exercise: - Group verbal and visual presentation on the components of exercise prescription. Introduces F.I.T.T principle from ACSM for exercise prescriptions  Reviews F.I.T.T. principles of resistance exercise including progression. Written material given at graduation.    Education: Exercise & Equipment Safety: - Individual verbal instruction and demonstration of equipment use and safety with use of the equipment. Flowsheet Row Cardiac Rehab from 01/16/2023 in [facility] Cardiac and Pulmonary Rehab  Date 01/16/23  Educator NT  Instruction Review Code 1- Verbalizes Understanding       Education: Exercise Physiology & General Exercise Guidelines: - Group verbal and written instruction with models to review the exercise physiology of the cardiovascular system and associated critical values. Provides general exercise guidelines with specific guidelines to those with heart or lung disease.    Education: Flexibility, Balance, Mind/Body Relaxation: - Group verbal and visual presentation with interactive activity on the components of exercise prescription. Introduces F.I.T.T principle from ACSM for exercise prescriptions. Reviews F.I.T.T. principles of flexibility and balance exercise training including progression. Also discusses the mind body connection.  Reviews various relaxation techniques to help reduce and manage stress (i.e. Deep breathing, progressive muscle relaxation, and visualization). Balance handout provided to take home. Written material given at graduation.   Activity Barriers & Risk Stratification:  Activity Barriers & Cardiac Risk Stratification - 01/16/23 1557       Activity Barriers & Cardiac Risk Stratification   Activity Barriers Joint Problems;Shortness of Breath;Neck/Spine Problems;Balance Concerns;History of Falls   L knee 30% disabled, R knee weak at times, neck hure in accident in past    Cardiac Risk Stratification High             6 Minute Walk:  6 Minute Walk     Row Name 01/16/23 1610         6 Minute Walk   Phase Initial     Distance 700 feet     Walk Time 6 minutes     # of Rest Breaks 0     MPH 1.33     METS 3.07     RPE 9     Perceived Dyspnea  2     VO2 Peak 10.74     Symptoms Yes (comment)     Comments knee pain 8/10, limp     Resting HR 98 bpm     Resting BP 122/82     Resting Oxygen Saturation  97 %     Exercise Oxygen Saturation  during 6 min walk 97 %     Max Ex. HR 114 bpm     Max Ex. BP 136/86     2 Minute Post BP 120/78              Oxygen Initial Assessment:   Oxygen Re-Evaluation:   Oxygen Discharge (Final Oxygen Re-Evaluation):   Initial Exercise  Prescription:  Initial Exercise Prescription - 01/16/23 1600       Date of Initial Exercise RX and Referring Provider   Date 01/16/23    Referring Provider Dr. Massie Maroon, MD      Oxygen   Maintain Oxygen Saturation 88% or higher      Treadmill   MPH 1.3    Grade 0    Minutes 15    METs 2      NuStep   Level 2    SPM 80    Minutes 15    METs 3.07      Biostep-RELP   Level 1    SPM 50    Minutes 15    METs 3.07      Track   Laps 15    Minutes 15    METs 1.82      Prescription Details   Frequency (times per week) 2    Duration Progress to 30 minutes of continuous aerobic without signs/symptoms of physical distress      Intensity   THRR 40-80% of Max Heartrate 126-155    Ratings of Perceived Exertion 11-15    Perceived Dyspnea 0-4      Progression   Progression Continue to progress workloads to maintain intensity without signs/symptoms of physical distress.      Resistance Training   Training Prescription Yes    Weight 4 lb    Reps 10-15             Perform Capillary Blood Glucose checks as needed.  Exercise Prescription Changes:   Exercise Prescription Changes     Row Name 01/16/23 1600 01/24/23 1200 02/09/23 1400 02/22/23 0700  03/06/23 1100     Response to Exercise   Blood Pressure (Admit) 122/82 124/76 142/82 108/60 --   Blood Pressure (Exercise) 136/86 138/78 156/90 132/64 --   Blood Pressure (Exit) 120/78 106/76 128/78 106/64 --   Heart Rate (Admit) 98 bpm 97 bpm 121 bpm 87 bpm --   Heart Rate (Exercise) 114 bpm 117 bpm 109 bpm 125 bpm --   Heart Rate (Exit) 97 bpm 92 bpm 99 bpm 101 bpm --   Oxygen Saturation (Admit) 97 % -- -- -- --   Oxygen Saturation (Exercise) 97 % -- -- -- --   Rating of Perceived Exertion (Exercise) 9 13 13 13  --   Perceived Dyspnea (Exercise) 2 -- 0 -- --   Symptoms knee pain 8/10, limp left knee pain none none --   Comments Results First full day of exercise -- -- --   Duration -- Progress to 30 minutes of  aerobic without signs/symptoms of physical distress Progress to 30 minutes of  aerobic without signs/symptoms of physical distress Continue with 30 min of aerobic exercise without signs/symptoms of physical distress. Continue with 30 min of aerobic exercise without signs/symptoms of physical distress.   Intensity -- THRR unchanged THRR unchanged THRR unchanged THRR unchanged     Progression   Progression -- Continue to progress workloads to maintain intensity without signs/symptoms of physical distress. Continue to progress workloads to maintain intensity without signs/symptoms of physical distress. Continue to progress workloads to maintain intensity without signs/symptoms of physical distress. Continue to progress workloads to maintain intensity without signs/symptoms of physical distress.   Average METs -- 1.5 1.61 1.87 1.87     Resistance Training   Training Prescription -- Yes Yes Yes Yes   Weight -- 4 lb 4 lb 4 lb 4 lb   Reps --  10-15 10-15 10-15 10-15     Interval Training   Interval Training -- No No No No     Treadmill   MPH -- 1.3 1.2 1.3 1.3   Grade -- 0 0 0 0   Minutes -- 15 15 15 15    METs -- 1.99 1.92 1.99 1.99     NuStep   Level -- -- -- 2 2   SPM --  -- -- 80 80   Minutes -- -- -- 15 15   METs -- -- -- 1.6 1.6     Biostep-RELP   Level -- 1 1 1 1    Minutes -- 15 15 15 15    METs -- 1 1.3 2 2      Home Exercise Plan   Plans to continue exercise at -- -- -- -- Home (comment)  walking at home   Frequency -- -- -- -- Add 2 additional days to program exercise sessions.   Initial Home Exercises Provided -- -- -- -- 03/06/23     Oxygen   Maintain Oxygen Saturation -- 88% or higher 88% or higher 88% or higher 88% or higher    Row Name 03/08/23 1000 03/23/23 1500 04/05/23 0800 04/17/23 1500 05/04/23 1500     Response to Exercise   Blood Pressure (Admit) 116/64 132/80 114/72 108/62 102/70   Blood Pressure (Exercise) 130/68 110/70 -- -- --   Blood Pressure (Exit) 118/62 112/70 122/78 104/72 126/72   Heart Rate (Admit) 103 bpm 121 bpm 97 bpm 112 bpm 118 bpm   Heart Rate (Exercise) 116 bpm 137 bpm 123 bpm 120 bpm 119 bpm   Heart Rate (Exit) 94 bpm 93 bpm 90 bpm 93 bpm 92 bpm   Rating of Perceived Exertion (Exercise) 14 15 15 13 13    Perceived Dyspnea (Exercise) 0 0 -- -- --   Symptoms none left knee pain -- none none   Duration Continue with 30 min of aerobic exercise without signs/symptoms of physical distress. Continue with 30 min of aerobic exercise without signs/symptoms of physical distress. Continue with 30 min of aerobic exercise without signs/symptoms of physical distress. Continue with 30 min of aerobic exercise without signs/symptoms of physical distress. Continue with 30 min of aerobic exercise without signs/symptoms of physical distress.   Intensity THRR unchanged THRR unchanged THRR unchanged THRR unchanged THRR unchanged     Progression   Progression Continue to progress workloads to maintain intensity without signs/symptoms of physical distress. Continue to progress workloads to maintain intensity without signs/symptoms of physical distress. Continue to progress workloads to maintain intensity without signs/symptoms of physical  distress. Continue to progress workloads to maintain intensity without signs/symptoms of physical distress. Continue to progress workloads to maintain intensity without signs/symptoms of physical distress.   Average METs 1.7 1.81 1.9 1.8 2.22     Resistance Training   Training Prescription Yes Yes Yes Yes Yes   Weight 4 lb 4 lb 4 lb 4 lb 4 lb   Reps 10-15 10-15 10-15 10-15 10-15     Interval Training   Interval Training No No No No No     Treadmill   MPH 1.5 1.4 1.7 1.6 1.6   Grade 0 0 0 1 1   Minutes 15 15 15 15 15    METs 2.15 2.07 2.3 2.45 2.45     Recumbant Bike   Level -- 1 -- -- --   Watts -- 25 -- -- --   Minutes -- 15 -- -- --   METs --  2.8 -- -- --     NuStep   Level 2 -- 2 4 --   Minutes 15 -- 15 30 --   METs 1.1 -- 2.2 1.3 --     T5 Nustep   Level -- -- -- 1 --   SPM -- -- -- 80 --   Minutes -- -- -- 30 --   METs -- -- -- 1.8 --     Biostep-RELP   Level 1 3 3 3 3    Minutes 15 15 15 15 15    METs 1 2 2 2 2      Track   Laps -- 8 8 -- --   Minutes -- -- 15 -- --   METs -- 1.44 1.44 -- --     Home Exercise Plan   Plans to continue exercise at Home (comment)  walking at home Home (comment)  walking at home Home (comment)  walking at home Home (comment)  walking at home Home (comment)  walking at home   Frequency Add 2 additional days to program exercise sessions. Add 2 additional days to program exercise sessions. Add 2 additional days to program exercise sessions. Add 2 additional days to program exercise sessions. Add 2 additional days to program exercise sessions.   Initial Home Exercises Provided 03/06/23 03/06/23 03/06/23 03/06/23 03/06/23     Oxygen   Maintain Oxygen Saturation 88% or higher 88% or higher 88% or higher 88% or higher 88% or higher            Exercise Comments:   Exercise Comments     Row Name 01/18/23 1134           Exercise Comments First full day of exercise!  Patient was oriented to gym and equipment including functions,  settings, policies, and procedures.  Patient's individual exercise prescription and treatment plan were reviewed.  All starting workloads were established based on the results of the 6 minute walk test done at initial orientation visit.  The plan for exercise progression was also introduced and progression will be customized based on patient's performance and goals.                Exercise Goals and Review:   Exercise Goals     Row Name 01/16/23 1558             Exercise Goals   Increase Physical Activity Yes       Intervention Provide advice, education, support and counseling about physical activity/exercise needs.;Develop an individualized exercise prescription for aerobic and resistive training based on initial evaluation findings, risk stratification, comorbidities and participant's personal goals.       Expected Outcomes Short Term: Attend rehab on a regular basis to increase amount of physical activity.;Long Term: Add in home exercise to make exercise part of routine and to increase amount of physical activity.;Long Term: Exercising regularly at least 3-5 days a week.       Increase Strength and Stamina Yes       Intervention Provide advice, education, support and counseling about physical activity/exercise needs.;Develop an individualized exercise prescription for aerobic and resistive training based on initial evaluation findings, risk stratification, comorbidities and participant's personal goals.       Expected Outcomes Short Term: Perform resistance training exercises routinely during rehab and add in resistance training at home;Long Term: Improve cardiorespiratory fitness, muscular endurance and strength as measured by increased METs and functional capacity ( );Short Term: Increase workloads from initial exercise prescription for resistance, speed, and METs.  Able to understand and use rate of perceived exertion (RPE) scale Yes       Intervention Provide education and  explanation on how to use RPE scale       Expected Outcomes Short Term: Able to use RPE daily in rehab to express subjective intensity level;Long Term:  Able to use RPE to guide intensity level when exercising independently       Able to understand and use Dyspnea scale Yes       Intervention Provide education and explanation on how to use Dyspnea scale       Expected Outcomes Short Term: Able to use Dyspnea scale daily in rehab to express subjective sense of shortness of breath during exertion;Long Term: Able to use Dyspnea scale to guide intensity level when exercising independently       Knowledge and understanding of Target Heart Rate Range (THRR) Yes       Intervention Provide education and explanation of THRR including how the numbers were predicted and where they are located for reference       Expected Outcomes Short Term: Able to state/look up THRR;Long Term: Able to use THRR to govern intensity when exercising independently;Short Term: Able to use daily as guideline for intensity in rehab       Able to check pulse independently Yes       Intervention Provide education and demonstration on how to check pulse in carotid and radial arteries.;Review the importance of being able to check your own pulse for safety during independent exercise       Expected Outcomes Short Term: Able to explain why pulse checking is important during independent exercise;Long Term: Able to check pulse independently and accurately       Understanding of Exercise Prescription Yes       Intervention Provide education, explanation, and written materials on patient's individual exercise prescription       Expected Outcomes Short Term: Able to explain program exercise prescription;Long Term: Able to explain home exercise prescription to exercise independently                Exercise Goals Re-Evaluation :  Exercise Goals Re-Evaluation     Row Name 01/18/23 1134 01/24/23 1242 02/09/23 1433 02/22/23 0804 02/27/23  1131     Exercise Goal Re-Evaluation   Exercise Goals Review Able to understand and use rate of perceived exertion (RPE) scale;Able to understand and use Dyspnea scale;Knowledge and understanding of Target Heart Rate Range (THRR);Understanding of Exercise Prescription Understanding of Exercise Prescription;Increase Physical Activity;Increase Strength and Stamina Understanding of Exercise Prescription;Increase Physical Activity;Increase Strength and Stamina Understanding of Exercise Prescription;Increase Physical Activity;Increase Strength and Stamina Increase Physical Activity;Understanding of Exercise Prescription;Increase Strength and Stamina   Comments Reviewed RPE and dyspnea scale, THR and program prescription with pt today.  Pt voiced understanding and was given a copy of goals to take home. Corey Barnes is off to a good start in the program. He did well during his first day on the treadmill at a speed of 1.3 mph with no incline. He also worked at level 1 on the biostep and used 4 lb hand weights for resistance training. We will continue to monitor his progress in the program. Corey Barnes is doing well in rehab. He was only able to attend one session during this review session. During this one session he was able to maintain his workload on the treadmill as well as his level on the biostep. We will continue to monitor his progress in the program.  Corey Barnes is doing well in rehab. He increased his speed on the treadmill to 1. and did level 2 on the T4 nustep. We will continue to monitor his progress in the program. He is doing well here at rehab. Likes to walk on treamill. Currently on T4 swithing between level 2 and level 1 depending on his strengh. His left leg can give him problems at times, he has a fall last week in the snow and feeling sore. He is walking his driveway 3-4 times per day when weather is safe.   Expected Outcomes Short: Use RPE daily to regulate intensity. Long: Follow program prescription in THR.  Short: Continue to follow current exercise prescription. Long: Continue exercise to improve strength and stamina. Short: Continue to follow current exercise prescription. Long: Continue exercise to improve strength and stamina. Short: Continue to progressively increase treadmill and T4 nustep workloads. Long: Continue exercise to improve strength and stamina. STg: increase workload as able. LTG: continue to exercise and improve strength and stamina    Row Name 03/06/23 1150 03/08/23 1046 03/23/23 1507 04/05/23 0840 04/10/23 1142     Exercise Goal Re-Evaluation   Exercise Goals Review Increase Physical Activity;Able to understand and use rate of perceived exertion (RPE) scale;Knowledge and understanding of Target Heart Rate Range (THRR);Understanding of Exercise Prescription;Increase Strength and Stamina;Able to understand and use Dyspnea scale;Able to check pulse independently Increase Physical Activity;Increase Strength and Stamina;Understanding of Exercise Prescription Increase Physical Activity;Increase Strength and Stamina;Understanding of Exercise Prescription Increase Physical Activity;Increase Strength and Stamina;Understanding of Exercise Prescription Increase Physical Activity;Increase Strength and Stamina;Knowledge and understanding of Target Heart Rate Range (THRR)   Comments Reviewed home exercise with pt today.  Pt plans to walk at home for exercise.  Reviewed THR, pulse, RPE, sign and symptoms, pulse oximetery and when to call 911 or MD.  Also discussed weather considerations and indoor options.  Pt voiced understanding. Corey Barnes is doing well in rehab. He was recently able to increase his speed on the treadmill to 1. with no incline. He was also able to maintain his level on the Biostep, T4, and XR. We will continue to monitor and encourage his progress in the program. Corey Barnes continues to do well in rehab. He was recently able to increase his level on the biostep from 1 to 3. He was also able to  add the recumbent bike at level 1 to his current exercise prescription. We will continue to monitor his progress in the program. Corey Barnes continues to do well in rehab. He increased his speed on the treadmill to 1. with no incline. He also maintained level 2 on the T4 nustep, level 3 on the biostep, and 8 laps on the track. We will continue to monitor his progress in the program. Corey Barnes is trying to walk on his off days duirng the program. He mentions he is going to try to walk outside now that the weather is warming up. He is going to think about trying to swim again   Expected Outcomes Short: add 1-2 days of walking at home on off days off days of cardiac rehab. Long: maintain independent exercise routine upon graduation from cardiac rehab. Short: Increase to level 2 on the biostep. Long: Continue exercise to improve strength and stamina. Short: Continue to work to increase treadmill workload. Long: Continue exercise to improve strength and stamina. Short: Try level 3 on the T4 nustep and push for more laps on the track. Long: Continue exercise to improve strength and stamina. Short: make decision  about trying to swim again and trying water aerobics. Long: independently manage exercise after graduation    Row Name 04/17/23 1154 04/17/23 1529 05/04/23 1557         Exercise Goal Re-Evaluation   Exercise Goals Review Increase Physical Activity;Increase Strength and Stamina;Knowledge and understanding of Target Heart Rate Range (THRR);Understanding of Exercise Prescription Increase Physical Activity;Understanding of Exercise Prescription;Increase Strength and Stamina Increase Physical Activity;Understanding of Exercise Prescription;Increase Strength and Stamina     Comments Corey Barnes is doing well here at rehab, his knee is a physical limitation but he still comes and will do both legs, then let his bad knee rest for 4 mins. He walks at home, enjoys the warmer weather. Encouraged him to continue to exercise  at home, look in to adding intentional exercise of upper body when the weather is poor or his knee is limiting him. Corey Barnes continues to do well in rehab. He increased his incline on the treadmill to 1% with a speed of 1.54mph. He also increased to level 4 on the T4 nustep and maintained level 3 on the biostep. He added the T5 nustep to his exercise prescription at level 1. We will continue to monitor his progress in the program. Corey Barnes is doing well in rehab. He was only able ot attend 2 sessions during this review period. In those sessions he was able to maintain his treadmill workload and  level 3 on the biostep. We will continue to monitor his progress in the program.     Expected Outcomes STG: Establish consisitent exercise routine outside of rehab to prepare for independent exercise. LTG: independently manage exercise after graduation. Short: Continue to progressively increase treadmill and biostep workloads. Long: Continue exercise to improve strength and stamina. Short: Continue to increase treadmill workload. Long: Continue exercise to increase strength and stamina.              Discharge Exercise Prescription (Final Exercise Prescription Changes):  Exercise Prescription Changes - 05/04/23 1500       Response to Exercise   Blood Pressure (Admit) 102/70    Blood Pressure (Exit) 126/72    Heart Rate (Admit) 118 bpm    Heart Rate (Exercise) 119 bpm    Heart Rate (Exit) 92 bpm    Rating of Perceived Exertion (Exercise) 13    Symptoms none    Duration Continue with 30 min of aerobic exercise without signs/symptoms of physical distress.    Intensity THRR unchanged      Progression   Progression Continue to progress workloads to maintain intensity without signs/symptoms of physical distress.    Average METs 2.22      Resistance Training   Training Prescription Yes    Weight 4 lb    Reps 10-15      Interval Training   Interval Training No      Treadmill   MPH 1.6    Grade 1     Minutes 15    METs 2.45      Biostep-RELP   Level 3    Minutes 15    METs 2      Home Exercise Plan   Plans to continue exercise at Home (comment)   walking at home   Frequency Add 2 additional days to program exercise sessions.    Initial Home Exercises Provided 03/06/23      Oxygen   Maintain Oxygen Saturation 88% or higher             Nutrition:  Target  Goals: Understanding of nutrition guidelines, daily intake of sodium 1500mg , cholesterol 200mg , calories 30% from fat and 7% or less from saturated fats, daily to have 5 or more servings of fruits and vegetables.  Education: All About Nutrition: -Group instruction provided by verbal, written material, interactive activities, discussions, models, and posters to present general guidelines for heart healthy nutrition including fat, fiber, MyPlate, the role of sodium in heart healthy nutrition, utilization of the nutrition label, and utilization of this knowledge for meal planning. Follow up email sent as well. Written material given at graduation. Flowsheet Row Cardiac Rehab from 01/16/2023 in [facility] Cardiac and Pulmonary Rehab  Education need identified 01/16/23       Biometrics:  Pre Biometrics - 01/16/23 1613       Pre Biometrics   Height 5\' 11"  (1.803 m)    Weight 210 lb 4.8 oz (95.4 kg)    Waist Circumference 40 inches    Hip Circumference 42 inches    Waist to Hip Ratio 0.95 %    BMI (Calculated) 29.34    Single Leg Stand 10.5 seconds             Post Biometrics - 01/16/23 1559        Post  Biometrics   Height --    Weight --    Waist Circumference --    Hip Circumference --    Waist to Hip Ratio --    BMI (Calculated) --    Single Leg Stand --             Nutrition Therapy Plan and Nutrition Goals:  Nutrition Therapy & Goals - 01/18/23 1526       Nutrition Therapy   Diet Cardiac, Low Na    Protein (specify units) 90    Fiber 30 grams    Whole Grain Foods 3 servings    Saturated Fats  15 max. grams    Fruits and Vegetables 5 servings/day    Sodium 2 grams      Personal Nutrition Goals   Nutrition Goal Eat 15-30gProtein and 30-60gCarbs at each meal.    Personal Goal #2 Read labels and reduce sodium intake to below 2300mg . Ideally 1500mg  per day.    Personal Goal #3 Reduce saturated fat, less than 12g per day. Replace bad fats for more heart healthy fats.    Comments Patient drinking mostly water, ~48-64oz daily. 1 can of soda per day. He has been gaining weight since he has become less physically active. He is trying to lose some weight with diet. He reports he rarely eats breakfast or lunch and will usually eat dinner. Spoke with him about the importance of spreading his calories and protein intake with fiber and healthy fats throughout the day. Encouraged him to log on myfitnesspal for a free resource to assess if he is under eating. Reviewed Mediterranean diet handout, educated on types of fats, sources, and how to read labels. Spoke about cutting back on sodium, he has already stopped using salt for cooking and seasoning. Reminded him to read labels as well, since much of sodium intake can come from processed and packaged foods. Built out several meals and snacks with foods he likes and will eat focusing on nutrient dense foods that will help him meet his protein, fiber, and calorie goals while keeping quantity of food low.      Intervention Plan   Intervention Prescribe, educate and counsel regarding individualized specific dietary modifications aiming towards targeted core components such as  weight, hypertension, lipid management, diabetes, heart failure and other comorbidities.;Nutrition handout(s) given to patient.    Expected Outcomes Short Term Goal: Understand basic principles of dietary content, such as calories, fat, sodium, cholesterol and nutrients.;Short Term Goal: A plan has been developed with personal nutrition goals set during dietitian appointment.;Long Term Goal:  Adherence to prescribed nutrition plan.             Nutrition Assessments:  MEDIFICTS Score Key: >=70 Need to make dietary changes  40-70 Heart Healthy Diet <= 40 Therapeutic Level Cholesterol Diet  Flowsheet Row Cardiac Rehab from 01/12/2023 in [facility] Cardiac and Pulmonary Rehab  Picture Your Plate Total Score on Admission 59      Picture Your Plate Scores: <16 Unhealthy dietary pattern with much room for improvement. 41-50 Dietary pattern unlikely to meet recommendations for good health and room for improvement. 51-60 More healthful dietary pattern, with some room for improvement.  >60 Healthy dietary pattern, although there may be some specific behaviors that could be improved.    Nutrition Goals Re-Evaluation:  Nutrition Goals Re-Evaluation     Row Name 02/06/23 1131 02/27/23 1138 04/10/23 1136 04/17/23 1139       Goals   Nutrition Goal Eat 15-30gProtein and 30-60gCarbs at each meal. -- -- --    Comment Corey Barnes reports he has had a hard time working on nutrition goals due to limited income and what he can afford to buy. He states that he tries his best to incorporate fruits, vegetables, and protein when he can afford it. He reports he has done better eating more food throughout the day and not just at one meal. He is reading labels and trying to limit sodium and saturated fat. Discussed some easy cheaper meal ideas to try and help him include more food without exceeding sodium and saturated fat limits. Corey Barnes is trying to continue to adhere to the guidelines set by the RD. He has been gaining weight but still only eating his one big meal daily, so he is thinking about mentioning that to his doctor. He is trying to stick to low sodium options. He has decreased his caffeine intake to one soda most days Corey Barnes is working on making better [employer], reading labels and working on keeping sodium controlled. Spoke with him about eating smaller meals throughout the day, rather than 1  large meal. Encouraged him to keep working on these changes as they can take time to become more habitiual.    Expected Outcome Short: be mindful of what he is eating and try to encorporate fresh foods when budget allows. Try to work on reading food labels.  Long: be able to maintain heart healthy diet as budget allows. STG: eat 2-3 meals per day , include healthy fats and low sodium frozen meals id needed. LTG: maintain heart healthy lifestyle changes Short: look at options for meal planning with doctor. Long: independently manage heart healthy diet STG: Eat smaller more frequent meals. LTG: independently manage heart healthy diet.      Personal Goal #2 Re-Evaluation   Personal Goal #2 Read labels and reduce sodium intake to below 2300mg . Ideally 1500mg  per day. -- -- --      Personal Goal #3 Re-Evaluation   Personal Goal #3 Reduce saturated fat, less than 12g per day. Replace bad fats for more heart healthy fats. -- -- --             Nutrition Goals Discharge (Final Nutrition Goals Re-Evaluation):  Nutrition Goals Re-Evaluation -  04/17/23 1139       Goals   Comment Corey Barnes is working on making better [employer], reading labels and working on keeping sodium controlled. Spoke with him about eating smaller meals throughout the day, rather than 1 large meal. Encouraged him to keep working on these changes as they can take time to become more habitiual.    Expected Outcome STG: Eat smaller more frequent meals. LTG: independently manage heart healthy diet.             Psychosocial: Target Goals: Acknowledge presence or absence of significant depression and/or stress, maximize coping skills, provide positive support system. Participant is able to verbalize types and ability to use techniques and skills needed for reducing stress and depression.   Education: Stress, Anxiety, and Depression - Group verbal and visual presentation to define topics covered.  Reviews how body is impacted by  stress, anxiety, and depression.  Also discusses healthy ways to reduce stress and to treat/manage anxiety and depression.  Written material given at graduation.   Education: Sleep Hygiene -Provides group verbal and written instruction about how sleep can affect your health.  Define sleep hygiene, discuss sleep cycles and impact of sleep habits. Review good sleep hygiene tips.    Initial Review & Psychosocial Screening:  Initial Psych Review & Screening - 01/12/23 1050       Initial Review   Current issues with Current Anxiety/Panic;Current Stress Concerns    Source of Stress Concerns Financial    Comments out of work  a year working on disability.   prescibed meds for anxiety.  is working on trting to redirect thoughts to decrease anxiety      Family Dynamics   Good Support System? Yes   oldest kid, Mom, some friends     Barriers   Psychosocial barriers to participate in program There are no identifiable barriers or psychosocial needs.      Screening Interventions   Interventions Encouraged to exercise    Expected Outcomes Short Term goal: Utilizing psychosocial counselor, staff and physician to assist with identification of specific Stressors or current issues interfering with healing process. Setting desired goal for each stressor or current issue identified.;Long Term Goal: Stressors or current issues are controlled or eliminated.;Short Term goal: Identification and review with participant of any Quality of Life or Depression concerns found by scoring the questionnaire.;Long Term goal: The participant improves quality of Life and PHQ9 Scores as seen by post scores and/or verbalization of changes             Quality of Life Scores:   Quality of Life - 01/12/23 1115       Quality of Life   Select Quality of Life      Quality of Life Scores   Health/Function Pre 12.94 %    Socioeconomic Pre 11.13 %    Psych/Spiritual Pre 13.75 %    Family Pre 13.64 %    GLOBAL Pre 16.1 %             Scores of 19 and below usually indicate a poorer quality of life in these areas.  A difference of  2-3 points is a clinically meaningful difference.  A difference of 2-3 points in the total score of the Quality of Life Index has been associated with significant improvement in overall quality of life, self-image, physical symptoms, and general health in studies assessing change in quality of life.  PHQ-9: Review Flowsheet  More data may exist  04/17/2023 04/10/2023 02/27/2023 02/06/2023 01/16/2023  Depression screen PHQ 2/9  Decreased Interest 2 2 1 2 3   Down, Depressed, Hopeless 3 1 1 1 2   PHQ - 2 Score 5 3 2 3 5   Altered sleeping 3 3 3 3 3   Tired, decreased energy 2 1 - 2 2  Change in appetite 2 0 0 2 2  Feeling bad or failure about yourself  3 2 2 3 1   Trouble concentrating 1 1 1 1 2   Moving slowly or fidgety/restless 1 1 0 0 0  Suicidal thoughts 0 0 0 0 0  PHQ-9 Score 17 11 8 14 15   Difficult doing work/chores Somewhat difficult Somewhat difficult Somewhat difficult Somewhat difficult Somewhat difficult   Interpretation of Total Score  Total Score Depression Severity:  1-4 = Minimal depression, 5-9 = Mild depression, 10-14 = Moderate depression, 15-19 = Moderately severe depression, 20-27 = Severe depression   Psychosocial Evaluation and Intervention:  Psychosocial Evaluation - 01/12/23 1109       Psychosocial Evaluation & Interventions   Interventions Encouraged to exercise with the program and follow exercise prescription;Relaxation education    Comments There are no barriers to attending the program. His Mom lives with him. She and his older son and smoe friends are his support.  He does have a history of anxiety and has meds to help as needed. He stated that he tries to think about other things to decrease the anxiety. He does have finacial concern as he has been out of work about a year. He is working with a Clinical research associate to get diasability.  He is ready to start the  program and work on feeling better    Expected Outcomes STG attend all sessions as scheduled, continue to work on methods to decrease anxiety response.  LTG able to manage anxiety symptoms    Continue Psychosocial Services  Follow up required by staff             Psychosocial Re-Evaluation:  Psychosocial Re-Evaluation     Row Name 02/06/23 1138 02/27/23 1134 04/10/23 1130 04/17/23 1145       Psychosocial Re-Evaluation   Current issues with Current Anxiety/Panic;Current Stress Concerns Current Anxiety/Panic;Current Stress Concerns;Current Sleep Concerns Current Stress Concerns Current Anxiety/Panic    Comments Patient reevaluated with another PHQ questionaire. His score went down from a 15 to a 14. He struggles with the fact that most of his hobbies require being physical and having mobility, with which he is limiited. He also has financial stressors currently. Continues with prescribed treatment. Reevaluated PHQ questionaire, he improved from 14 to 8. He still struggles with losing hobbies due to cost of disability. Struggles to sleep most nights, reports anxiety is a big barrier to getting to sleep. He take his meds and has a psych evaluation coming up in a month or so. PHQ went from 8 to 11 this check. He mentions he just has been feeling more down these last few weeks, but mentions he spent time outside yesterday for the first time in a while which made him feel better. After his knee injury at work in 2018, he mentions that he now can't do any of his hobbies he used to enjoy. We discussed swimming or water aerobics and he is going to think about trying that. His kids are his main support system and continue to help keep him motivated to work on his health. He doesn't really feel a difference with his mental health since starting the program, but  notes that he wants to continue exercising after he graduates because he knows its good for him and will feel the benefit eventually. He notes he feels  more jittery than usual and is going to talk to his doctor about a refill on his anxiety medications. He has had his physical to determine his disability standing and is waiting for the results for his disability coverage PHQ went from 11 to 17 since last taken ~1week ago. He has been feeling more anxiety as of late. He does have a Dr appointment today to address the anxiety, he is expecting to get medication to support him with this. He has a good support system with his family. He does feel better after exercising or getting fresh air, especially when the weather is nice.    Expected Outcomes Short: continue to exercise to try to gain strength and mobility so he can start doing hobbies he previously enjoyed. Long: maintian good mental health habits. STG: focus on improving strength, take meds as prescribed. LTG: achieve and maintain positive outlook on health and daily life Short: talk with doctor about anxiety medication Long: find hobbies that he is able to do with his knee injury STG: Continue to exercise without hurting knee, speak with Dr about anxiety medication. LTG: look to find hobbies he is able to do with knee injury    Interventions Encouraged to attend Cardiac Rehabilitation for the exercise Encouraged to attend Cardiac Rehabilitation for the exercise Stress management education;Relaxation education Encouraged to attend Cardiac Rehabilitation for the exercise    Continue Psychosocial Services  Follow up required by staff Follow up required by staff Follow up required by staff Follow up required by staff      Initial Review   Source of Stress Concerns -- -- Unable to perform yard/household activities --             Psychosocial Discharge (Final Psychosocial Re-Evaluation):  Psychosocial Re-Evaluation - 04/17/23 1145       Psychosocial Re-Evaluation   Current issues with Current Anxiety/Panic    Comments PHQ went from 11 to 17 since last taken ~1week ago. He has been feeling more  anxiety as of late. He does have a Dr appointment today to address the anxiety, he is expecting to get medication to support him with this. He has a good support system with his family. He does feel better after exercising or getting fresh air, especially when the weather is nice.    Expected Outcomes STG: Continue to exercise without hurting knee, speak with Dr about anxiety medication. LTG: look to find hobbies he is able to do with knee injury    Interventions Encouraged to attend Cardiac Rehabilitation for the exercise    Continue Psychosocial Services  Follow up required by staff             Vocational Rehabilitation: Provide vocational rehab assistance to qualifying candidates.   Vocational Rehab Evaluation & Intervention:  Vocational Rehab - 04/10/23 1130       Initial Vocational Rehab Evaluation & Intervention   Assessment shows need for Vocational Rehabilitation No             Education: Education Goals: Education classes will be provided on a variety of topics geared toward better understanding of heart health and risk factor modification. Participant will state understanding/return demonstration of topics presented as noted by education test scores.  Learning Barriers/Preferences:   General Cardiac Education Topics:  AED/CPR: - Group verbal and written instruction with the  use of models to demonstrate the basic use of the AED with the basic ABC's of resuscitation.   Anatomy and Cardiac Procedures: - Group verbal and visual presentation and models provide information about basic cardiac anatomy and function. Reviews the testing methods done to diagnose heart disease and the outcomes of the test results. Describes the treatment choices: Medical Management, Angioplasty, or Coronary Bypass Surgery for treating various heart conditions including Myocardial Infarction, Angina, Valve Disease, and Cardiac Arrhythmias.  Written material given at graduation.   Medication  Safety: - Group verbal and visual instruction to review commonly prescribed medications for heart and lung disease. Reviews the medication, class of the drug, and side effects. Includes the steps to properly store meds and maintain the prescription regimen.  Written material given at graduation.   Intimacy: - Group verbal instruction through game format to discuss how heart and lung disease can affect sexual intimacy. Written material given at graduation..   Know Your Numbers and Heart Failure: - Group verbal and visual instruction to discuss disease risk factors for cardiac and pulmonary disease and treatment options.  Reviews associated critical values for Overweight/Obesity, Hypertension, Cholesterol, and Diabetes.  Discusses basics of heart failure: signs/symptoms and treatments.  Introduces Heart Failure Zone chart for action plan for heart failure.  Written material given at graduation. Flowsheet Row Cardiac Rehab from 01/16/2023 in [facility] Cardiac and Pulmonary Rehab  Education need identified 01/16/23       Infection Prevention: - Provides verbal and written material to individual with discussion of infection control including proper hand washing and proper equipment cleaning during exercise session. Flowsheet Row Cardiac Rehab from 01/16/2023 in [facility] Cardiac and Pulmonary Rehab  Date 01/16/23  Educator NT  Instruction Review Code 1- Verbalizes Understanding       Falls Prevention: - Provides verbal and written material to individual with discussion of falls prevention and safety. Flowsheet Row Cardiac Rehab from 01/16/2023 in [facility] Cardiac and Pulmonary Rehab  Date 01/12/23  Educator SB  Instruction Review Code 1- Verbalizes Understanding       Other: -Provides group and verbal instruction on various topics (see comments)   Knowledge Questionnaire Score:  Knowledge Questionnaire Score - 01/12/23 1117       Knowledge Questionnaire Score   Pre Score 23/26  missed NTG,  nutrition and exercise             Core Components/Risk Factors/Patient Goals at Admission:  Personal Goals and Risk Factors at Admission - 01/12/23 1138       Core Components/Risk Factors/Patient Goals on Admission   Number of packs per day Corey Barnes is a former tobacco user. Intervention for tobacco cessation was provided at the initial medical review. He was asked about when he  quit and reported 11/14/2022 right before his surgery . Patient was advised and educated about tobacco cessation using combination therapy, tobacco cessation classes, quit line, and quit smoking apps. Patient demonstrated understanding of this material. Staff will continue to provide encouragement and follow up with the patient throughout the progra             Education:Diabetes - Individual verbal and written instruction to review signs/symptoms of diabetes, desired ranges of glucose level fasting, after meals and with exercise. Acknowledge that pre and post exercise glucose checks will be done for 3 sessions at entry of program.   Core Components/Risk Factors/Patient Goals Review:   Goals and Risk Factor Review     Row Name 02/06/23 1128 02/27/23 1140 04/10/23 1124 04/17/23  1136       Core Components/Risk Factors/Patient Goals Review   Personal Goals Review Tobacco Cessation;Hypertension Tobacco Cessation;Hypertension Weight Management/Obesity;Hypertension;Lipids Hypertension    Review Corey Barnes reports that he is still tobacco free. He also stated that he takes all his BP meds and that he does have a wrist blood pressure monitor at home that he uses to monitor is BP. He had an elevated reading a while ago and ended up in the ED and has a follow up with his cardiologist tomorrow from this ED vist. He was encouraged to bring in his wrist monitor to class to have [place] check its accuacy. He reports he is still tobacco free. Commended him on this goal. He is taking his medications, but misplaced his wrist BP monitor.  spoke with him about looking for it and if found bringing to Rehab to measure against ours to ensure accuracy. Met with his cardiolgist, which he reports went well and that Dr is happy with his progress, will have his cholesterol labs checked next cardiolist appointment Corey Barnes reports he has been enjoying the program so far. His blood pressure readings at home have been higher than in the program, so he is planning on bringing his home bp monitor in so we can compare readings. He has been tolerating the repatha well which pleases him since he can't tolerate statins. He will have his cholesterol labs checked in August at his next appointment. He is concerned that he is still gaining weight even though he is not eating more food. Discussed signs of fluid retention and so far he is not having any. He is thinking about having a conversation with his doctor related to his weight. Corey Barnes has been montioring her heart rate, which he feels has been higher than normal, has spoken with her cardiologist. He hasnt been able to find his blood pressure cuff, so hasnt been checking his blood pressure at home. Encouraged him to bring it in to compare if he finds it, and if he cant find it soon, look in to a replacement as its important he can check his blood pressure.    Expected Outcomes Short: bring in home BP cuff to have it checked for accuacy. Long: continue to monitor BP at home and maintain tobacco free status. STG: find BP cuff and bring in, take meds and communicate with medical team as needed. LTG: Check BP at home and maintain tobacco free lifestyle Short: bring in bp cuff to check accuracy and continue to work on cholesterol management. Long: independenlty manage risk factors STG: find or replace home blood pressure cuff. LTG: check blood pressure independently             Core Components/Risk Factors/Patient Goals at Discharge (Final Review):   Goals and Risk Factor Review - 04/17/23 1136       Core  Components/Risk Factors/Patient Goals Review   Personal Goals Review Hypertension    Review Corey Barnes has been montioring her heart rate, which he feels has been higher than normal, has spoken with her cardiologist. He hasnt been able to find his blood pressure cuff, so hasnt been checking his blood pressure at home. Encouraged him to bring it in to compare if he finds it, and if he cant find it soon, look in to a replacement as its important he can check his blood pressure.    Expected Outcomes STG: find or replace home blood pressure cuff. LTG: check blood pressure independently  ITP Comments:  ITP Comments     Row Name 01/12/23 1102 01/16/23 1551 01/18/23 0942 01/18/23 1134 02/15/23 1250   ITP Comments Virtual orientation call completed today. he has an appointment on Date: 01/16/2023  for EP eval and gym Orientation.  Documentation of diagnosis [facility] be found in [facility] Date: 10/30/204 . Completed and gym orientation. Initial ITP created and sent for review to Dr. Bethann Punches, Medical Director. 30 Day review completed. Medical Director ITP review done, changes made as directed, and signed approval by Medical Director.   new to program First full day of exercise!  Patient was oriented to gym and equipment including functions, settings, policies, and procedures.  Patient's individual exercise prescription and treatment plan were reviewed.  All starting workloads were established based on the results of the 6 minute walk test done at initial orientation visit.  The plan for exercise progression was also introduced and progression will be customized based on patient's performance and goals. 30 Day review completed. Medical Director ITP review done, changes made as directed, and signed approval by Medical Director.    new to program    Row Name 03/15/23 0731 04/12/23 0850 05/10/23 0735       ITP Comments 30 Day review completed. Medical Director ITP review done, changes made as directed, and  signed approval by Medical Director. 30 Day review completed. Medical Director ITP review done, changes made as directed, and signed approval by Medical Director. 30 Day review completed. Medical Director ITP review done, changes made as directed, and signed approval by Medical Director.              Comments:

## 2023-05-15 ENCOUNTER — Encounter: Payer: Medicaid Other | Admitting: *Deleted

## 2023-05-15 DIAGNOSIS — Z951 Presence of aortocoronary bypass graft: Secondary | ICD-10-CM | POA: Diagnosis not present

## 2023-05-15 NOTE — Progress Notes (Signed)
 Daily Session Note  Patient Details  Name: Corey Barnes MRN: [id number] Date of Birth: 05-06-1970 Referring Provider:   Flowsheet Row Cardiac Rehab from 01/16/2023 in [facility] Cardiac and Pulmonary Rehab  Referring Provider Dr. Teddie Favre, MD       Encounter Date: 05/15/2023  Check In:  Session Check In - 05/15/23 1109       Check-In   Supervising physician immediately available to respond to emergencies See telemetry face sheet for immediately available ER MD    Location [facility]-Cardiac & Pulmonary Rehab    Staff Present Maud Sorenson, RN, BSN, CCRP;Maxon Conetta BS, Exercise Physiologist;Kelly [employer], ACSM CEP, Exercise Physiologist;Noah Tickle, BS, Exercise Physiologist;Jason Martina Sledge RDN,LDN    Virtual Visit No    Medication changes reported     No    Fall or balance concerns reported    No    Warm-up and Cool-down Performed on first and last piece of equipment    Resistance Training Performed Yes    VAD Patient? No    PAD/SET Patient? No      Pain Assessment   Currently in Pain? No/denies                Social History   Tobacco Use  Smoking Status Former   Current packs/day: 0.50   Average packs/day: 0.5 packs/day for 32.0 years (16.0 ttl pk-yrs)   Types: Cigarettes  Smokeless Tobacco Never    Goals Met:  Independence with exercise equipment Exercise tolerated well No report of concerns or symptoms today  Goals Unmet:  Not Applicable  Comments: Pt able to follow exercise prescription today without complaint.  Will continue to monitor for progression.    Dr. Firman Hughes is Medical Director for [facility] Cardiac Rehabilitation.  Dr. Fuad Aleskerov is Medical Director for [facility] Pulmonary Rehabilitation.

## 2023-05-17 ENCOUNTER — Encounter: Payer: Medicaid Other | Admitting: *Deleted

## 2023-05-17 DIAGNOSIS — Z951 Presence of aortocoronary bypass graft: Secondary | ICD-10-CM

## 2023-05-17 NOTE — Progress Notes (Signed)
 Daily Session Note  Patient Details  Name: Corey Barnes MRN: [id number] Date of Birth: 04-Mar-1970 Referring Provider:   Flowsheet Row Cardiac Rehab from 01/16/2023 in [facility] Cardiac and Pulmonary Rehab  Referring Provider Dr. Teddie Favre, MD       Encounter Date: 05/17/2023  Check In:  Session Check In - 05/17/23 1155       Check-In   Supervising physician immediately available to respond to emergencies See telemetry face sheet for immediately available ER MD    Location [facility]-Cardiac & Pulmonary Rehab    Staff Present Lyell Samuel, MS, Exercise Physiologist;Maxon Conetta BS, Exercise Physiologist;Leodan Bolyard, RN, BSN, CCRP;Meredith Craven RN,BSN    Virtual Visit No    Medication changes reported     No    Fall or balance concerns reported    No    Warm-up and Cool-down Performed on first and last piece of equipment    Resistance Training Performed Yes    VAD Patient? No    PAD/SET Patient? No      Pain Assessment   Currently in Pain? No/denies                Social History   Tobacco Use  Smoking Status Former   Current packs/day: 0.50   Average packs/day: 0.5 packs/day for 32.0 years (16.0 ttl pk-yrs)   Types: Cigarettes  Smokeless Tobacco Never    Goals Met:  Independence with exercise equipment Exercise tolerated well No report of concerns or symptoms today  Goals Unmet:  Not Applicable  Comments: Pt able to follow exercise prescription today without complaint.  Will continue to monitor for progression.    Dr. Firman Hughes is Medical Director for [facility] Cardiac Rehabilitation.  Dr. Fuad Aleskerov is Medical Director for [facility] Pulmonary Rehabilitation.

## 2023-05-22 ENCOUNTER — Encounter: Admitting: *Deleted

## 2023-05-22 VITALS — Ht 71.0 in | Wt 229.3 lb

## 2023-05-22 DIAGNOSIS — Z951 Presence of aortocoronary bypass graft: Secondary | ICD-10-CM | POA: Diagnosis not present

## 2023-05-22 NOTE — Progress Notes (Signed)
 Daily Session Note  Patient Details  Name: Corey Barnes MRN: [id number] Date of Birth: 1970-06-05 Referring Provider:   Flowsheet Row Cardiac Rehab from 01/16/2023 in [facility] Cardiac and Pulmonary Rehab  Referring Provider Dr. Teddie Favre, MD       Encounter Date: 05/22/2023  Check In:  Session Check In - 05/22/23 1117       Check-In   Supervising physician immediately available to respond to emergencies See telemetry face sheet for immediately available ER MD    Location [facility]-Cardiac & Pulmonary Rehab    Staff Present Maud Sorenson, RN, BSN, CCRP;Joseph Hood RCP,RRT,BSRT;Maxon Vilonia BS, Exercise Physiologist;Kelly [employer], ACSM CEP, Exercise Physiologist    Virtual Visit No    Medication changes reported     No    Fall or balance concerns reported    No    Warm-up and Cool-down Performed on first and last piece of equipment    Resistance Training Performed Yes    VAD Patient? No    PAD/SET Patient? No      Pain Assessment   Currently in Pain? No/denies                Social History   Tobacco Use  Smoking Status Former   Current packs/day: 0.50   Average packs/day: 0.5 packs/day for 32.0 years (16.0 ttl pk-yrs)   Types: Cigarettes  Smokeless Tobacco Never    Goals Met:  Independence with exercise equipment Exercise tolerated well No report of concerns or symptoms today  Goals Unmet:  Not Applicable  Comments: Pt able to follow exercise prescription today without complaint.  Will continue to monitor for progression.    Dr. Firman Hughes is Medical Director for [facility] Cardiac Rehabilitation.  Dr. Fuad Aleskerov is Medical Director for [facility] Pulmonary Rehabilitation.

## 2023-05-22 NOTE — Patient Instructions (Signed)
 Discharge Patient Instructions  Patient Details  Name: Corey Barnes MRN: [id number] Date of Birth: 1970-02-12 Referring Provider:  [facility]   Number of Visits: 36  Reason for Discharge:  Patient reached a stable level of exercise. Patient independent in their exercise. Patient has met program and personal goals.   Diagnosis:  S/P CABG x 2  Initial Exercise Prescription:  Initial Exercise Prescription - 01/16/23 1600       Date of Initial Exercise RX and Referring Provider   Date 01/16/23    Referring Provider Dr. Teddie Favre, MD      Oxygen   Maintain Oxygen Saturation 88% or higher      Treadmill   MPH 1.3    Grade 0    Minutes 15    METs 2      NuStep   Level 2    SPM 80    Minutes 15    METs 3.07      Biostep-RELP   Level 1    SPM 50    Minutes 15    METs 3.07      Track   Laps 15    Minutes 15    METs 1.82      Prescription Details   Frequency (times per week) 2    Duration Progress to 30 minutes of continuous aerobic without signs/symptoms of physical distress      Intensity   THRR 40-80% of Max Heartrate 126-155    Ratings of Perceived Exertion 11-15    Perceived Dyspnea 0-4      Progression   Progression Continue to progress workloads to maintain intensity without signs/symptoms of physical distress.      Resistance Training   Training Prescription Yes    Weight 4 lb    Reps 10-15             Discharge Exercise Prescription (Final Exercise Prescription Changes):  Exercise Prescription Changes - 05/19/23 0700       Response to Exercise   Blood Pressure (Admit) 124/64    Blood Pressure (Exit) 120/82    Heart Rate (Admit) 87 bpm    Heart Rate (Exercise) 110 bpm    Heart Rate (Exit) 85 bpm    Rating of Perceived Exertion (Exercise) 13    Symptoms none    Duration Continue with 30 min of aerobic exercise without signs/symptoms of physical distress.    Intensity THRR unchanged      Progression   Progression  Continue to progress workloads to maintain intensity without signs/symptoms of physical distress.    Average METs 2.17      Resistance Training   Training Prescription Yes    Weight 4 lb    Reps 10-15      Interval Training   Interval Training No      Treadmill   MPH 1.7    Grade 0    Minutes 15    METs 2.3      REL-XR   Level 3    Minutes 15    METs 2      Biostep-RELP   Level 3    Minutes 15      Home Exercise Plan   Plans to continue exercise at Home (comment)   walking at home   Frequency Add 2 additional days to program exercise sessions.    Initial Home Exercises Provided 03/06/23      Oxygen   Maintain Oxygen Saturation 88% or higher  Functional Capacity:  6 Minute Walk     Row Name 01/16/23 1610 05/22/23 1127       6 Minute Walk   Phase Initial Discharge    Distance 700 feet 865 feet    Distance % Change -- 23.6 %    Distance Feet Change -- 165 ft    Walk Time 6 minutes 6 minutes    # of Rest Breaks 0 0    MPH 1.33 1.64    METS 3.07 2.9    RPE 9 13    Perceived Dyspnea  2 1    VO2 Peak 10.74 10.2    Symptoms Yes (comment) No    Comments knee pain 8/10, limp Bilateral knee pain, Right hip pain, limp    Resting HR 98 bpm 82 bpm    Resting BP 122/82 102/70    Resting Oxygen Saturation  97 % 97 %    Exercise Oxygen Saturation  during 6 min walk 97 % 97 %    Max Ex. HR 114 bpm 97 bpm    Max Ex. BP 136/86 122/64    2 Minute Post BP 120/78 --             Nutrition & Weight - Outcomes:  Pre Biometrics - 01/16/23 1613       Pre Biometrics   Height 5\' 11"  (1.803 m)    Weight 210 lb 4.8 oz (95.4 kg)    Waist Circumference 40 inches    Hip Circumference 42 inches    Waist to Hip Ratio 0.95 %    BMI (Calculated) 29.34    Single Leg Stand 10.5 seconds             Post Biometrics - 05/22/23 1129        Post  Biometrics   Height 5\' 11"  (1.803 m)    Weight 229 lb 4.8 oz (104 kg)    Waist Circumference 42 inches     Hip Circumference 42.5 inches    Waist to Hip Ratio 0.99 %    BMI (Calculated) 32    Single Leg Stand 4 seconds

## 2023-05-24 ENCOUNTER — Encounter

## 2023-05-24 DIAGNOSIS — Z951 Presence of aortocoronary bypass graft: Secondary | ICD-10-CM

## 2023-05-24 NOTE — Progress Notes (Signed)
 Daily Session Note  Patient Details  Name: Corey Barnes MRN: [id number] Date of Birth: 1970-08-06 Referring Provider:   Flowsheet Row Cardiac Rehab from 01/16/2023 in [facility] Cardiac and Pulmonary Rehab  Referring Provider Dr. Teddie Favre, MD       Encounter Date: 05/24/2023  Check In:  Session Check In - 05/24/23 1128       Check-In   Supervising physician immediately available to respond to emergencies See telemetry face sheet for immediately available ER MD    Location [facility]-Cardiac & Pulmonary Rehab    Staff Present Lyell Samuel, MS, Exercise Physiologist;Maxon Beauford Bounds, Exercise Physiologist;Joseph Hood RCP,RRT,BSRT;Meredith Craven RN,BSN;Leauna Sharber, RN, BSN, CCRP    Virtual Visit No    Medication changes reported     No    Fall or balance concerns reported    No    Warm-up and Cool-down Performed on first and last piece of equipment    Resistance Training Performed Yes    VAD Patient? No    PAD/SET Patient? No      Pain Assessment   Currently in Pain? No/denies                Social History   Tobacco Use  Smoking Status Former   Current packs/day: 0.50   Average packs/day: 0.5 packs/day for 32.0 years (16.0 ttl pk-yrs)   Types: Cigarettes  Smokeless Tobacco Never    Goals Met:  Independence with exercise equipment Exercise tolerated well No report of concerns or symptoms today  Goals Unmet:  Not Applicable  Comments: Pt able to follow exercise prescription today without complaint.  Will continue to monitor for progression.    Dr. Firman Hughes is Medical Director for [facility] Cardiac Rehabilitation.  Dr. Fuad Aleskerov is Medical Director for [facility] Pulmonary Rehabilitation.

## 2023-05-29 ENCOUNTER — Encounter: Admitting: *Deleted

## 2023-05-29 DIAGNOSIS — Z951 Presence of aortocoronary bypass graft: Secondary | ICD-10-CM | POA: Diagnosis not present

## 2023-05-29 NOTE — Progress Notes (Signed)
 Daily Session Note  Patient Details  Name: Corey Barnes MRN: [id number] Date of Birth: 04/18/1970 Referring Provider:   Flowsheet Row Cardiac Rehab from 01/16/2023 in [facility] Cardiac and Pulmonary Rehab  Referring Provider Dr. Teddie Favre, MD       Encounter Date: 05/29/2023  Check In:  Session Check In - 05/29/23 1132       Check-In   Supervising physician immediately available to respond to emergencies See telemetry face sheet for immediately available ER MD    Location [facility]-Cardiac & Pulmonary Rehab    Staff Present Maud Sorenson, RN, BSN, CCRP;Meredith Manson Seitz RN,BSN;Joseph Hood RCP,RRT,BSRT;Kelly Sylvester BS, ACSM CEP, Exercise Physiologist;Maxon Conetta BS, Exercise Physiologist    Virtual Visit No    Medication changes reported     No    Fall or balance concerns reported    No    Warm-up and Cool-down Performed on first and last piece of equipment    Resistance Training Performed Yes    VAD Patient? No    PAD/SET Patient? No      Pain Assessment   Currently in Pain? No/denies                Social History   Tobacco Use  Smoking Status Former   Current packs/day: 0.50   Average packs/day: 0.5 packs/day for 32.0 years (16.0 ttl pk-yrs)   Types: Cigarettes  Smokeless Tobacco Never    Goals Met:  Independence with exercise equipment Exercise tolerated well No report of concerns or symptoms today  Goals Unmet:  Not Applicable  Comments: Pt able to follow exercise prescription today without complaint.  Will continue to monitor for progression.    Dr. Firman Hughes is Medical Director for [facility] Cardiac Rehabilitation.  Dr. Fuad Aleskerov is Medical Director for [facility] Pulmonary Rehabilitation.

## 2023-05-31 ENCOUNTER — Encounter: Admitting: *Deleted

## 2023-05-31 DIAGNOSIS — Z951 Presence of aortocoronary bypass graft: Secondary | ICD-10-CM | POA: Diagnosis not present

## 2023-05-31 NOTE — Progress Notes (Signed)
 Daily Session Note  Patient Details  Name: Corey Barnes LUTH MRN: [id number] Date of Birth: [birth year] Referring Provider:   Flowsheet Row Cardiac Rehab from 01/16/2023 in [facility] Cardiac and Pulmonary Rehab  Referring Provider Dr. Teddie Favre, MD       Encounter Date: 05/31/2023  Check In:  Session Check In - 05/31/23 1434       Check-In   Supervising physician immediately available to respond to emergencies See telemetry face sheet for immediately available ER MD    Location [facility]-Cardiac & Pulmonary Rehab    Staff Present Maxon Conetta BS, Exercise Physiologist;Noah Tickle, BS, Exercise Physiologist;Bellamy Rubey, RN, BSN, CCRP;Joseph Hood RCP,RRT,BSRT    Virtual Visit No    Medication changes reported     No    Fall or balance concerns reported    No    Warm-up and Cool-down Performed on first and last piece of equipment    Resistance Training Performed Yes    VAD Patient? No    PAD/SET Patient? No      Pain Assessment   Currently in Pain? No/denies                Social History   Tobacco Use  Smoking Status Former   Current packs/day: 0.50   Average packs/day: 0.5 packs/day for 32.0 years (16.0 ttl pk-yrs)   Types: Cigarettes  Smokeless Tobacco Never    Goals Met:  Independence with exercise equipment Exercise tolerated well No report of concerns or symptoms today  Goals Unmet:  Not Applicable  Comments: Pt able to follow exercise prescription today without complaint.  Will continue to monitor for progression.    Dr. Firman Hughes is Medical Director for [facility] Cardiac Rehabilitation.  Dr. Fuad Aleskerov is Medical Director for [facility] Pulmonary Rehabilitation.

## 2023-06-05 ENCOUNTER — Encounter

## 2023-06-07 ENCOUNTER — Encounter: Payer: Self-pay | Admitting: *Deleted

## 2023-06-07 ENCOUNTER — Encounter: Attending: Cardiology | Admitting: *Deleted

## 2023-06-07 DIAGNOSIS — Z951 Presence of aortocoronary bypass graft: Secondary | ICD-10-CM | POA: Insufficient documentation

## 2023-06-07 NOTE — Progress Notes (Signed)
 30 Day review completed. Medical Director ITP review done, changes made as directed, and signed approval by Medical Director. ? ?

## 2023-06-07 NOTE — Progress Notes (Signed)
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 nustep and push for more laps on the track. Long: Continue exercise to improve strength and stamina. Short: make decision about trying to swim again and trying water aerobics. Long: independently manage exercise after graduation    Row Name 04/17/23 1154 04/17/23 1529 05/04/23 1557 05/19/23 0758 05/24/23 1127     Exercise Goal Re-Evaluation   Exercise Goals Review Increase Physical Activity;Increase Strength and Stamina;Knowledge and understanding of Target Heart Rate Range (THRR);Understanding of Exercise Prescription Increase Physical Activity;Understanding of Exercise Prescription;Increase Strength and Stamina Increase Physical Activity;Understanding of Exercise Prescription;Increase Strength and Stamina Increase Physical Activity;Understanding of Exercise Prescription;Increase Strength and Stamina Increase Physical Activity;Increase Strength and Stamina;Understanding of Exercise Prescription   Comments Corey Barnes is doing well here at  rehab, his knee is a physical limitation but he still comes and will do both legs, then let his bad knee rest for 4 mins. He walks at home, enjoys the warmer weather. Encouraged him to continue  to exercise at home, look in to adding intentional exercise of upper body when the weather is poor or his knee is limiting him. Corey Barnes continues to do well in rehab. He increased his incline on the treadmill to 1% with a speed of 1.26mph. He also increased to level 4 on the T4 nustep and maintained level 3 on the biostep. He added the T5 nustep to his exercise prescription at level 1. We will continue to monitor his progress in the program. Corey Barnes is doing well in rehab. He was only able ot attend 2 sessions during this review period. In those sessions he was able to maintain his treadmill workload and  level 3 on the biostep. We will continue to monitor his progress in the program. Corey Barnes continues to do well in rehab. He continues to work at level 3 on the biostep and began using the XR at level 3 as well. He also continues to walk the treadmill at 1.7 mph with no incline. We will continue to monitor his progress in the program. Corey Barnes continue to attend rehab, working on increasing workload on treadmill and biostep he has a bad left knee but does as much as he can before giving his knee a rest. Will continue to monitor and support his progress   Expected Outcomes STG: Establish consisitent exercise routine outside of rehab to prepare for independent exercise. LTG: independently manage exercise after graduation. Short: Continue to progressively increase treadmill and biostep workloads. Long: Continue exercise to improve strength and stamina. Short: Continue to increase treadmill workload. Long: Continue exercise to increase strength and stamina. Short: Progressively increase treadmill workload. Long: Continue exercise to increase strength and stamina. STG: Increase workload without injuring knee. LTG: Continue exercise to  increase strength and stamina.    Row Name 06/01/23 0843             Exercise Goal Re-Evaluation   Exercise Goals Review Increase Physical Activity;Increase Strength and Stamina;Understanding of Exercise Prescription       Comments Corey Barnes continues to do well in rehab. He recently completed his post-6MWT and was able to improve by 147ft. He was also able to maintain his workload on the treadmill at a speed of 1.99mph. We will continue to monitor his progress in the program.       Expected Outcomes Short: Graduate. Long: Continue exercise to improve strength and stamina.                Discharge Exercise Prescription (Final Exercise Prescription Changes):  Exercise Prescription Changes - 06/01/23 0800       Response to Exercise   Blood Pressure (Admit) 118/64    Blood Pressure (Exit) 116/64    Heart Rate (Admit) 87 bpm    Heart Rate (Exercise) 102 bpm    Heart Rate (Exit) 84 bpm    Oxygen Saturation (Admit) 96 %    Oxygen Saturation (Exercise) 95 %    Oxygen Saturation (Exit) 96 %    Rating of Perceived Exertion (Exercise) 12    Symptoms none    Duration Continue with 30 min of aerobic exercise without signs/symptoms of physical distress.    Intensity THRR unchanged      Progression   Progression Continue to progress workloads to maintain intensity without signs/symptoms of physical distress.    Average METs 2.1      Resistance Training   Training Prescription Yes    Weight 4 lb    Reps 10-15      Interval Training  Interval Training No      Treadmill   MPH 1.6    Grade 0    Minutes 15    METs 2.23      Biostep-RELP   Level 3    Minutes 15    METs 2      Home Exercise Plan   Plans to continue exercise at Home (comment)   walking at home   Frequency Add 2 additional days to program exercise sessions.    Initial Home Exercises Provided 03/06/23      Oxygen   Maintain Oxygen Saturation 88% or higher             Nutrition:  Target Goals:  Understanding of nutrition guidelines, daily intake of sodium 1500mg , cholesterol 200mg , calories 30% from fat and 7% or less from saturated fats, daily to have 5 or more servings of fruits and vegetables.  Education: All About Nutrition: -Group instruction provided by verbal, written material, interactive activities, discussions, models, and posters to present general guidelines for heart healthy nutrition including fat, fiber, MyPlate, the role of sodium in heart healthy nutrition, utilization of the nutrition label, and utilization of this knowledge for meal planning. Follow up email sent as well. Written material given at graduation. Flowsheet Row Cardiac Rehab from 01/16/2023 in [facility]  Education need identified 01/16/23       Biometrics:  Pre Biometrics - 01/16/23 1613       Pre Biometrics   Height 5\' 11"  (1.803 m)    Weight 210 lb 4.8 oz (95.4 kg)    Waist Circumference 40 inches    Hip Circumference 42 inches    Waist to Hip Ratio 0.95 %    BMI (Calculated) 29.34    Single Leg Stand 10.5 seconds             Post Biometrics - 05/22/23 1129        Post  Biometrics   Height 5\' 11"  (1.803 m)    Weight 229 lb 4.8 oz (104 kg)    Waist Circumference 42 inches    Hip Circumference 42.5 inches    Waist to Hip Ratio 0.99 %    BMI (Calculated) 32    Single Leg Stand 4 seconds             Nutrition Therapy Plan and Nutrition Goals:  Nutrition Therapy & Goals - 01/18/23 1526       Nutrition Therapy   Diet Cardiac, Low Na    Protein (specify units) 90    Fiber 30 grams    Whole Grain Foods 3 servings    Saturated Fats 15 max. grams    Fruits and Vegetables 5 servings/day    Sodium 2 grams      Personal Nutrition Goals   Nutrition Goal Eat 15-30gProtein and 30-60gCarbs at each meal.    Personal Goal #2 Read labels and reduce sodium intake to below 2300mg . Ideally 1500mg  per day.    Personal Goal #3 Reduce saturated fat, less than 12g  per day. Replace bad fats for more heart healthy fats.    Comments Patient drinking mostly water, ~48-64oz daily. 1 can of soda per day. He has been gaining weight since he has become less physically active. He is trying to lose some weight with diet. He reports he rarely eats breakfast or lunch and will usually eat dinner. Spoke with him about the importance of spreading his calories and protein intake with fiber and healthy fats  throughout the day. Encouraged him to log on myfitnesspal for a free resource to assess if he is under eating. Reviewed Mediterranean diet handout, educated on types of fats, sources, and how to read labels. Spoke about cutting back on sodium, he has already stopped using salt for cooking and seasoning. Reminded him to read labels as well, since much of sodium intake can come from processed and packaged foods. Built out several meals and snacks with foods he likes and will eat focusing on nutrient dense foods that will help him meet his protein, fiber, and calorie goals while keeping quantity of food low.      Intervention Plan   Intervention Prescribe, educate and counsel regarding individualized specific dietary modifications aiming towards targeted core components such as weight, hypertension, lipid management, diabetes, heart failure and other comorbidities.;Nutrition handout(s) given to patient.    Expected Outcomes Short Term Goal: Understand basic principles of dietary content, such as calories, fat, sodium, cholesterol and nutrients.;Short Term Goal: A plan has been developed with personal nutrition goals set during dietitian appointment.;Long Term Goal: Adherence to prescribed nutrition plan.             Nutrition Assessments:  MEDIFICTS Score Key: >=70 Need to make dietary changes  40-70 Heart Healthy Diet <= 40 Therapeutic Level Cholesterol Diet  Flowsheet Row Cardiac Rehab from 05/24/2023 in [facility] Cardiac and Pulmonary Rehab  Picture Your Plate Total Score on  Discharge 64      Picture Your Plate Scores: <16 Unhealthy dietary pattern with much room for improvement. 41-50 Dietary pattern unlikely to meet recommendations for good health and room for improvement. 51-60 More healthful dietary pattern, with some room for improvement.  >60 Healthy dietary pattern, although there may be some specific behaviors that could be improved.    Nutrition Goals Re-Evaluation:  Nutrition Goals Re-Evaluation     Row Name 02/06/23 1131 02/27/23 1138 04/10/23 1136 04/17/23 1139 05/24/23 1133     Goals   Nutrition Goal Eat 15-30gProtein and 30-60gCarbs at each meal. -- -- -- --   Comment Shane reports he has had a hard time working on nutrition goals due to limited income and what he can afford to buy. He states that he tries his best to incorporate fruits, vegetables, and protein when he can afford it. He reports he has done better eating more food throughout the day and not just at one meal. He is reading labels and trying to limit sodium and saturated fat. Discussed some easy cheaper meal ideas to try and help him include more food without exceeding sodium and saturated fat limits. Corey Barnes is trying to continue to adhere to the guidelines set by the RD. He has been gaining weight but still only eating his one big meal daily, so he is thinking about mentioning that to his doctor. He is trying to stick to low sodium options. He has decreased his caffeine intake to one soda most days Corey Barnes is working on making better [employer], reading labels and working on keeping sodium controlled. Spoke with him about eating smaller meals throughout the day, rather than 1 large meal. Encouraged him to keep working on these changes as they can take time to become more habitiual. Corey Barnes is eating ~45meals per day now, though finances are a worry for him. Says healthy foods can be expensive. Provided some good food ideas that are cheap to try. But food is expensive, will continue to monitor  and support. he reports he is meet his protein  goals.   Expected Outcome Short: be mindful of what he is eating and try to encorporate fresh foods when budget allows. Try to work on reading food labels.  Long: be able to maintain heart healthy diet as budget allows. STG: eat 2-3 meals per day , include healthy fats and low sodium frozen meals id needed. LTG: maintain heart healthy lifestyle changes Short: look at options for meal planning with doctor. Long: independently manage heart healthy diet STG: Eat smaller more frequent meals. LTG: independently manage heart healthy diet. STG: Eat smaller more frequent meals/snacks. LTG: independently manage heart healthy diet.     Personal Goal #2 Re-Evaluation   Personal Goal #2 Read labels and reduce sodium intake to below 2300mg . Ideally 1500mg  per day. -- -- -- --     Personal Goal #3 Re-Evaluation   Personal Goal #3 Reduce saturated fat, less than 12g per day. Replace bad fats for more heart healthy fats. -- -- -- --            Nutrition Goals Discharge (Final Nutrition Goals Re-Evaluation):  Nutrition Goals Re-Evaluation - 05/24/23 1133       Goals   Comment Corey Barnes is eating ~19meals per day now, though finances are a worry for him. Says healthy foods can be expensive. Provided some good food ideas that are cheap to try. But food is expensive, will continue to monitor and support. he reports he is meet his protein goals.    Expected Outcome STG: Eat smaller more frequent meals/snacks. LTG: independently manage heart healthy diet.             Psychosocial: Target Goals: Acknowledge presence or absence of significant depression and/or stress, maximize coping skills, provide positive support system. Participant is able to verbalize types and ability to use techniques and skills needed for reducing stress and depression.   Education: Stress, Anxiety, and Depression - Group verbal and visual presentation to define topics covered.  Reviews how  body is impacted by stress, anxiety, and depression.  Also discusses healthy ways to reduce stress and to treat/manage anxiety and depression.  Written material given at graduation.   Education: Sleep Hygiene -Provides group verbal and written instruction about how sleep can affect your health.  Define sleep hygiene, discuss sleep cycles and impact of sleep habits. Review good sleep hygiene tips.    Initial Review & Psychosocial Screening:  Initial Psych Review & Screening - 01/12/23 1050       Initial Review   Current issues with Current Anxiety/Panic;Current Stress Concerns    Source of Stress Concerns Financial    Comments out of work  a year working on disability.   prescibed meds for anxiety.  is working on trting to redirect thoughts to decrease anxiety      Family Dynamics   Good Support System? Yes   oldest kid, Mom, some friends     Barriers   Psychosocial barriers to participate in program There are no identifiable barriers or psychosocial needs.      Screening Interventions   Interventions Encouraged to exercise    Expected Outcomes Short Term goal: Utilizing psychosocial counselor, staff and physician to assist with identification of specific Stressors or current issues interfering with healing process. Setting desired goal for each stressor or current issue identified.;Long Term Goal: Stressors or current issues are controlled or eliminated.;Short Term goal: Identification and review with participant of any Quality of Life or Depression concerns found by scoring the questionnaire.;Long Term goal: The participant improves quality  of Life and PHQ9 Scores as seen by post scores and/or verbalization of changes             Quality of Life Scores:   Quality of Life - 05/24/23 1459       Quality of Life   Select Quality of Life      Quality of Life Scores   Health/Function Post 10.7 %    Socioeconomic Post 15.63 %    Psych/Spiritual Post 12.86 %    Family Post 21.6 %     GLOBAL Post 13.81 %            Scores of 19 and below usually indicate a poorer quality of life in these areas.  A difference of  2-3 points is a clinically meaningful difference.  A difference of 2-3 points in the total score of the Quality of Life Index has been associated with significant improvement in overall quality of life, self-image, physical symptoms, and general health in studies assessing change in quality of life.  PHQ-9: Review Flowsheet  More data exists      05/24/2023 04/17/2023 04/10/2023 02/27/2023 02/06/2023  Depression screen PHQ 2/9  Decreased Interest 2 2 2 2 1 2   Down, Depressed, Hopeless 2 3 3 1 1 1   PHQ - 2 Score 4 5 5 3 2 3   Altered sleeping 3 3 3 3 3 3   Tired, decreased energy 2 2 1  - 2  Change in appetite 1 1 2  0 0 2  Feeling bad or failure about yourself  3 2 3 2 2 3   Trouble concentrating 2 1 1 1 1 1   Moving slowly or fidgety/restless 0 1 1 1  0 0  Suicidal thoughts 0 0 0 0 0 0  PHQ-9 Score 15 13 17 11 8 14   Difficult doing work/chores Somewhat difficult Somewhat difficult Somewhat difficult Somewhat difficult Somewhat difficult Somewhat difficult    Details       Multiple values from one day are sorted in reverse-chronological order        Interpretation of Total Score  Total Score Depression Severity:  1-4 = Minimal depression, 5-9 = Mild depression, 10-14 = Moderate depression, 15-19 = Moderately severe depression, 20-27 = Severe depression   Psychosocial Evaluation and Intervention:  Psychosocial Evaluation - 01/12/23 1109       Psychosocial Evaluation & Interventions   Interventions Encouraged to exercise with the program and follow exercise prescription;Relaxation education    Comments There are no barriers to attending the program. His Mom lives with him. She and his older son and smoe friends are his support.  He does have a history of anxiety and has meds to help as needed. He stated that he tries to think about other things to  decrease the anxiety. He does have finacial concern as he has been out of work about a year. He is working with a Clinical research associate to get diasability.  He is ready to start the program and work on feeling better    Expected Outcomes STG attend all sessions as scheduled, continue to work on methods to decrease anxiety response.  LTG able to manage anxiety symptoms    Continue Psychosocial Services  Follow up required by staff             Psychosocial Re-Evaluation:  Psychosocial Re-Evaluation     Row Name 02/06/23 1138 02/27/23 1134 04/10/23 1130 04/17/23 1145 05/24/23 1130     Psychosocial Re-Evaluation   Current issues with Current Anxiety/Panic;Current Stress  Concerns Current Anxiety/Panic;Current Stress Concerns;Current Sleep Concerns Current Stress Concerns Current Anxiety/Panic Current Anxiety/Panic;Current Sleep Concerns   Comments Patient reevaluated with another PHQ questionaire. His score went down from a 15 to a 14. He struggles with the fact that most of his hobbies require being physical and having mobility, with which he is limiited. He also has financial stressors currently. Continues with prescribed treatment. Reevaluated PHQ questionaire, he improved from 14 to 8. He still struggles with losing hobbies due to cost of disability. Struggles to sleep most nights, reports anxiety is a big barrier to getting to sleep. He take his meds and has a psych evaluation coming up in a month or so. PHQ went from 8 to 11 this check. He mentions he just has been feeling more down these last few weeks, but mentions he spent time outside yesterday for the first time in a while which made him feel better. After his knee injury at work in 2018, he mentions that he now can't do any of his hobbies he used to enjoy. We discussed swimming or water aerobics and he is going to think about trying that. His kids are his main support system and continue to help keep him motivated to work on his health. He doesn't really  feel a difference with his mental health since starting the program, but notes that he wants to continue exercising after he graduates because he knows its good for him and will feel the benefit eventually. He notes he feels more jittery than usual and is going to talk to his doctor about a refill on his anxiety medications. He has had his physical to determine his disability standing and is waiting for the results for his disability coverage PHQ went from 11 to 17 since last taken ~1week ago. He has been feeling more anxiety as of late. He does have a Dr appointment today to address the anxiety, he is expecting to get medication to support him with this. He has a good support system with his family. He does feel better after exercising or getting fresh air, especially when the weather is nice. Corey Barnes continues to work with his medical team on his mental health. He reports he was diagnosed with PTSD and depression recently and was given some medication. He hasnt been sleeping well for quite some time. He was given some sleep medication but he hasnt taken any, reports dislike of medication. Encouraged and supported him to focus on getting good sleep, if he is uncomfortable taking the precribed medication then he should speak with her Dr about it because sleep is important.   Expected Outcomes Short: continue to exercise to try to gain strength and mobility so he can start doing hobbies he previously enjoyed. Long: maintian good mental health habits. STG: focus on improving strength, take meds as prescribed. LTG: achieve and maintain positive outlook on health and daily life Short: talk with doctor about anxiety medication Long: find hobbies that he is able to do with his knee injury STG: Continue to exercise without hurting knee, speak with Dr about anxiety medication. LTG: look to find hobbies he is able to do with knee injury STG: practice good sleep hygiene and communicate with Dr. Linzie Rickers: look to find hobbies he is  able to do with knee injury   Interventions Encouraged to attend Cardiac Rehabilitation for the exercise Encouraged to attend Cardiac Rehabilitation for the exercise Stress management education;Relaxation education Encouraged to attend Cardiac Rehabilitation for the exercise Encouraged to attend Cardiac Rehabilitation  for the exercise   Continue Psychosocial Services  Follow up required by staff Follow up required by staff Follow up required by staff Follow up required by staff Follow up required by staff     Initial Review   Source of Stress Concerns -- -- Unable to perform yard/household activities -- --            Psychosocial Discharge (Final Psychosocial Re-Evaluation):  Psychosocial Re-Evaluation - 05/24/23 1130       Psychosocial Re-Evaluation   Current issues with Current Anxiety/Panic;Current Sleep Concerns    Comments Corey Barnes continues to work with his medical team on his mental health. He reports he was diagnosed with PTSD and depression recently and was given some medication. He hasnt been sleeping well for quite some time. He was given some sleep medication but he hasnt taken any, reports dislike of medication. Encouraged and supported him to focus on getting good sleep, if he is uncomfortable taking the precribed medication then he should speak with her Dr about it because sleep is important.    Expected Outcomes STG: practice good sleep hygiene and communicate with Dr. Linzie Rickers: look to find hobbies he is able to do with knee injury    Interventions Encouraged to attend Cardiac Rehabilitation for the exercise    Continue Psychosocial Services  Follow up required by staff             Vocational Rehabilitation: Provide vocational rehab assistance to qualifying candidates.   Vocational Rehab Evaluation & Intervention:  Vocational Rehab - 04/10/23 1130       Initial Vocational Rehab Evaluation & Intervention   Assessment shows need for Vocational Rehabilitation No              Education: Education Goals: Education classes will be provided on a variety of topics geared toward better understanding of heart health and risk factor modification. Participant will state understanding/return demonstration of topics presented as noted by education test scores.  Learning Barriers/Preferences:   General Cardiac Education Topics:  AED/CPR: - Group verbal and written instruction with the use of models to demonstrate the basic use of the AED with the basic ABC's of resuscitation.   Anatomy and Cardiac Procedures: - Group verbal and visual presentation and models provide information about basic cardiac anatomy and function. Reviews the testing methods done to diagnose heart disease and the outcomes of the test results. Describes the treatment choices: Medical Management, Angioplasty, or Coronary Bypass Surgery for treating various heart conditions including Myocardial Infarction, Angina, Valve Disease, and Cardiac Arrhythmias.  Written material given at graduation.   Medication Safety: - Group verbal and visual instruction to review commonly prescribed medications for heart and lung disease. Reviews the medication, class of the drug, and side effects. Includes the steps to properly store meds and maintain the prescription regimen.  Written material given at graduation.   Intimacy: - Group verbal instruction through game format to discuss how heart and lung disease can affect sexual intimacy. Written material given at graduation..   Know Your Numbers and Heart Failure: - Group verbal and visual instruction to discuss disease risk factors for cardiac and pulmonary disease and treatment options.  Reviews associated critical values for Overweight/Obesity, Hypertension, Cholesterol, and Diabetes.  Discusses basics of heart failure: signs/symptoms and treatments.  Introduces Heart Failure Zone chart for action plan for heart failure.  Written material given at  graduation. Flowsheet Row Cardiac Rehab from 01/16/2023 in [facility] Cardiac and Pulmonary Rehab  Education need identified 01/16/23  Infection Prevention: - Provides verbal and written material to individual with discussion of infection control including proper hand washing and proper equipment cleaning during exercise session. Flowsheet Row Cardiac Rehab from 01/16/2023 in [facility] Cardiac and Pulmonary Rehab  Date 01/16/23  Educator NT  Instruction Review Code 1- Verbalizes Understanding       Falls Prevention: - Provides verbal and written material to individual with discussion of falls prevention and safety. Flowsheet Row Cardiac Rehab from 01/16/2023 in [facility] Cardiac and Pulmonary Rehab  Date 01/12/23  Educator SB  Instruction Review Code 1- Verbalizes Understanding       Other: -Provides group and verbal instruction on various topics (see comments)   Knowledge Questionnaire Score:  Knowledge Questionnaire Score - 05/24/23 1503       Knowledge Questionnaire Score   Post Score 23/26             Core Components/Risk Factors/Patient Goals at Admission:  Personal Goals and Risk Factors at Admission - 01/12/23 1138       Core Components/Risk Factors/Patient Goals on Admission   Number of packs per day Corey Barnes is a former tobacco user. Intervention for tobacco cessation was provided at the initial medical review. He was asked about when he  quit and reported 11/14/2022 right before his surgery . Patient was advised and educated about tobacco cessation using combination therapy, tobacco cessation classes, quit line, and quit smoking apps. Patient demonstrated understanding of this material. Staff will continue to provide encouragement and follow up with the patient throughout the progra             Education:Diabetes - Individual verbal and written instruction to review signs/symptoms of diabetes, desired ranges of glucose level fasting, after meals and with  exercise. Acknowledge that pre and post exercise glucose checks will be done for 3 sessions at entry of program.   Core Components/Risk Factors/Patient Goals Review:   Goals and Risk Factor Review     Row Name 02/06/23 1128 02/27/23 1140 04/10/23 1124 04/17/23 1136 05/24/23 1135     Core Components/Risk Factors/Patient Goals Review   Personal Goals Review Tobacco Cessation;Hypertension Tobacco Cessation;Hypertension Weight Management/Obesity;Hypertension;Lipids Hypertension Hypertension   Review Corey Barnes reports that he is still tobacco free. He also stated that he takes all his BP meds and that he does have a wrist blood pressure monitor at home that he uses to monitor is BP. He had an elevated reading a while ago and ended up in the ED and has a follow up with his cardiologist tomorrow from this ED vist. He was encouraged to bring in his wrist monitor to class to have us  check its accuacy. He reports he is still tobacco free. Commended him on this goal. He is taking his medications, but misplaced his wrist BP monitor. spoke with him about looking for it and if found bringing to Rehab to measure against ours to ensure accuracy. Met with his cardiolgist, which he reports went well and that Dr is happy with his progress, will have his cholesterol labs checked next cardiolist appointment Corey Barnes reports he has been enjoying the program so far. His blood pressure readings at home have been higher than in the program, so he is planning on bringing his home bp monitor in so we can compare readings. He has been tolerating the repatha well which pleases him since he can't tolerate statins. He will have his cholesterol labs checked in August at his next appointment. He is concerned that he is still gaining weight even  though he is not eating more food. Discussed signs of fluid retention and so far he is not having any. He is thinking about having a conversation with his doctor related to his weight. Corey Barnes has been  montioring her heart rate, which he feels has been higher than normal, has spoken with her cardiologist. He hasnt been able to find his blood pressure cuff, so hasnt been checking his blood pressure at home. Encouraged him to bring it in to compare if he finds it, and if he cant find it soon, look in to a replacement as its important he can check his blood pressure. Corey Barnes did find his blood pressure cuff, reports it reads ~20points higher than the rehab BP cuffs. Reminded him to bring it in and see if there can be any calibrations made or to look for a new more accurate one.   Expected Outcomes Short: bring in home BP cuff to have it checked for accuacy. Long: continue to monitor BP at home and maintain tobacco free status. STG: find BP cuff and bring in, take meds and communicate with medical team as needed. LTG: Check BP at home and maintain tobacco free lifestyle Short: bring in bp cuff to check accuracy and continue to work on cholesterol management. Long: independenlty manage risk factors STG: find or replace home blood pressure cuff. LTG: check blood pressure independently STG: bring in BP cuff. LTG: check blood pressure independently            Core Components/Risk Factors/Patient Goals at Discharge (Final Review):   Goals and Risk Factor Review - 05/24/23 1135       Core Components/Risk Factors/Patient Goals Review   Personal Goals Review Hypertension    Review Corey Barnes did find his blood pressure cuff, reports it reads ~20points higher than the rehab BP cuffs. Reminded him to bring it in and see if there can be any calibrations made or to look for a new more accurate one.    Expected Outcomes STG: bring in BP cuff. LTG: check blood pressure independently             ITP Comments:  ITP Comments     Row Name 01/12/23 1102 01/16/23 1551 01/18/23 0942 01/18/23 1134 02/15/23 1250   ITP Comments Virtual orientation call completed today. he has an appointment on Date: 01/16/2023  for EP  eval and gym Orientation.  Documentation of diagnosis can be found in [facility] Date: 10/30/204 . Completed and gym orientation. Initial ITP created and sent for review to Dr. Firman Hughes, Medical Director. 30 Day review completed. Medical Director ITP review done, changes made as directed, and signed approval by Medical Director.   new to program First full day of exercise!  Patient was oriented to gym and equipment including functions, settings, policies, and procedures.  Patient's individual exercise prescription and treatment plan were reviewed.  All starting workloads were established based on the results of the 6 minute walk test done at initial orientation visit.  The plan for exercise progression was also introduced and progression will be customized based on patient's performance and goals. 30 Day review completed. Medical Director ITP review done, changes made as directed, and signed approval by Medical Director.    new to program    Row Name 03/15/23 0731 04/12/23 0850 05/10/23 0735 06/07/23 1153     ITP Comments 30 Day review completed. Medical Director ITP review done, changes made as directed, and signed approval by Medical Director. 30 Day review completed. Medical  Director ITP review done, changes made as directed, and signed approval by Wellsite geologist. 30 Day review completed. Medical Director ITP review done, changes made as directed, and signed approval by Medical Director. 30 Day review completed. Medical Director ITP review done, changes made as directed, and signed approval by Medical Director.             Comments:

## 2023-06-07 NOTE — Progress Notes (Signed)
 Daily Session Note  Patient Details  Name: Corey Barnes MRN: [id number] Date of Birth: 10-19-1970 Referring Provider:   Flowsheet Row Cardiac Rehab from 01/16/2023 in [facility] Cardiac and Pulmonary Rehab  Referring Provider Dr. Teddie Favre, MD       Encounter Date: 06/07/2023  Check In:  Session Check In - 06/07/23 1111       Check-In   Supervising physician immediately available to respond to emergencies See telemetry face sheet for immediately available ER MD    Location [facility]-Cardiac & Pulmonary Rehab    Staff Present Maud Sorenson, RN, BSN, CCRP;Meredith Manson Seitz RN,BSN;Joseph Hood RCP,RRT,BSRT;Maxon Prattville BS, Exercise Physiologist;Laureen Bevin Bucks, BS, RRT, CPFT    Virtual Visit No    Medication changes reported     No    Fall or balance concerns reported    No    Warm-up and Cool-down Performed on first and last piece of equipment    Resistance Training Performed Yes    VAD Patient? No    PAD/SET Patient? No      Pain Assessment   Currently in Pain? No/denies                Social History   Tobacco Use  Smoking Status Former   Current packs/day: 0.50   Average packs/day: 0.5 packs/day for 32.0 years (16.0 ttl pk-yrs)   Types: Cigarettes  Smokeless Tobacco Never    Goals Met:  Independence with exercise equipment Exercise tolerated well No report of concerns or symptoms today  Goals Unmet:  Not Applicable  Comments: Pt able to follow exercise prescription today without complaint.  Will continue to monitor for progression.    Dr. Firman Hughes is Medical Director for [facility] Cardiac Rehabilitation.  Dr. Fuad Aleskerov is Medical Director for [facility] Pulmonary Rehabilitation.

## 2023-06-12 ENCOUNTER — Encounter: Admitting: *Deleted

## 2023-06-12 DIAGNOSIS — Z951 Presence of aortocoronary bypass graft: Secondary | ICD-10-CM | POA: Diagnosis not present

## 2023-06-12 NOTE — Progress Notes (Signed)
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 of walking at home on off days off days of cardiac rehab. Long: maintain independent exercise routine upon graduation from cardiac rehab. Short: Increase to level 2 on the biostep. Long: Continue exercise to improve strength and stamina. Short: Continue to work to increase treadmill workload. Long: Continue exercise to improve strength and stamina. Short: Try level 3 on the T4 nustep and push for more laps on the track. Long: Continue exercise to improve strength and stamina. Short: make decision about trying to swim again and trying water aerobics. Long: independently manage exercise after graduation    Row Name 04/17/23 1154 04/17/23 1529 05/04/23 1557 05/19/23 0758 05/24/23 1127     Exercise Goal Re-Evaluation   Exercise Goals Review Increase Physical Activity;Increase Strength and Stamina;Knowledge and understanding of Target Heart Rate Range (THRR);Understanding of Exercise Prescription Increase Physical Activity;Understanding of  Exercise Prescription;Increase Strength and Stamina Increase Physical Activity;Understanding of Exercise Prescription;Increase Strength and Stamina Increase Physical Activity;Understanding of Exercise Prescription;Increase Strength  and Stamina Increase Physical Activity;Increase Strength and Stamina;Understanding of Exercise Prescription   Comments Corey Barnes is doing well here at rehab, his knee is a physical limitation but he still comes and will do both legs, then let his bad knee rest for 4 mins. He walks at home, enjoys the warmer weather. Encouraged him to continue to exercise at home, look in to adding intentional exercise of upper body when the weather is poor or his knee is limiting him. Corey Barnes continues to do well in rehab. He increased his incline on the treadmill to 1% with a speed of 1.66mph. He also increased to level 4 on the T4 nustep and maintained level 3 on the biostep. He added the T5 nustep to his exercise prescription at level 1. We will continue to monitor his progress in the program. Corey Barnes is doing well in rehab. He was only able ot attend 2 sessions during this review period. In those sessions he was able to maintain his treadmill workload and  level 3 on the biostep. We will continue to monitor his progress in the program. Corey Barnes continues to do well in rehab. He continues to work at level 3 on the biostep and began using the XR at level 3 as well. He also continues to walk the treadmill at 1.7 mph with no incline. We will continue to monitor his progress in the program. Corey Barnes continue to attend rehab, working on increasing workload on treadmill and biostep he has a bad left knee but does as much as he can before giving his knee a rest. Will continue to monitor and support his progress   Expected Outcomes STG: Establish consisitent exercise routine outside of rehab to prepare for independent exercise. LTG: independently manage exercise after graduation. Short: Continue to progressively  increase treadmill and biostep workloads. Long: Continue exercise to improve strength and stamina. Short: Continue to increase treadmill workload. Long: Continue exercise to increase strength and stamina. Short: Progressively increase treadmill workload. Long: Continue exercise to increase strength and stamina. STG: Increase workload without injuring knee. LTG: Continue exercise to increase strength and stamina.    Row Name 06/01/23 0843             Exercise Goal Re-Evaluation   Exercise Goals Review Increase Physical Activity;Increase Strength and Stamina;Understanding of Exercise Prescription       Comments Corey Barnes continues to do well in rehab. He recently completed his post-6MWT and was able to improve by 155ft. He was also able to maintain his workload on the treadmill at a speed of 1.12mph. We will continue to monitor his progress in the program.       Expected Outcomes Short: Graduate. Long: Continue exercise to improve strength and stamina.                Discharge Exercise Prescription (Final Exercise Prescription Changes):  Exercise Prescription Changes - 06/01/23 0800       Response to Exercise   Blood Pressure (Admit) 118/64    Blood Pressure (Exit) 116/64    Heart Rate (Admit) 87 bpm    Heart Rate (Exercise) 102 bpm    Heart Rate (Exit) 84 bpm    Oxygen Saturation (Admit) 96 %    Oxygen Saturation (Exercise) 95 %    Oxygen Saturation (Exit) 96 %    Rating of Perceived Exertion (Exercise) 12    Symptoms none    Duration Continue with 30 min of aerobic exercise without signs/symptoms of physical distress.    Intensity THRR  unchanged      Progression   Progression Continue to progress workloads to maintain intensity without signs/symptoms of physical distress.    Average METs 2.1      Resistance Training   Training Prescription Yes    Weight 4 lb    Reps 10-15      Interval Training   Interval Training No      Treadmill   MPH 1.6    Grade 0    Minutes 15     METs 2.23      Biostep-RELP   Level 3    Minutes 15    METs 2      Home Exercise Plan   Plans to continue exercise at Home (comment)   walking at home   Frequency Add 2 additional days to program exercise sessions.    Initial Home Exercises Provided 03/06/23      Oxygen   Maintain Oxygen Saturation 88% or higher             Nutrition:  Target Goals: Understanding of nutrition guidelines, daily intake of sodium 1500mg , cholesterol 200mg , calories 30% from fat and 7% or less from saturated fats, daily to have 5 or more servings of fruits and vegetables.  Education: All About Nutrition: -Group instruction provided by verbal, written material, interactive activities, discussions, models, and posters to present general guidelines for heart healthy nutrition including fat, fiber, MyPlate, the role of sodium in heart healthy nutrition, utilization of the nutrition label, and utilization of this knowledge for meal planning. Follow up email sent as well. Written material given at graduation. Flowsheet Row Cardiac Rehab from 01/16/2023 in [facility]  Education need identified 01/16/23       Biometrics:  Pre Biometrics - 01/16/23 1613       Pre Biometrics   Height 5\' 11"  (1.803 m)    Weight 210 lb 4.8 oz (95.4 kg)    Waist Circumference 40 inches    Hip Circumference 42 inches    Waist to Hip Ratio 0.95 %    BMI (Calculated) 29.34    Single Leg Stand 10.5 seconds             Post Biometrics - 05/22/23 1129        Post  Biometrics   Height 5\' 11"  (1.803 m)    Weight 229 lb 4.8 oz (104 kg)    Waist Circumference 42 inches    Hip Circumference 42.5 inches    Waist to Hip Ratio 0.99 %    BMI (Calculated) 32    Single Leg Stand 4 seconds             Nutrition Therapy Plan and Nutrition Goals:  Nutrition Therapy & Goals - 01/18/23 1526       Nutrition Therapy   Diet Cardiac, Low Na    Protein (specify units) 90    Fiber 30 grams     Whole Grain Foods 3 servings    Saturated Fats 15 max. grams    Fruits and Vegetables 5 servings/day    Sodium 2 grams      Personal Nutrition Goals   Nutrition Goal Eat 15-30gProtein and 30-60gCarbs at each meal.    Personal Goal #2 Read labels and reduce sodium intake to below 2300mg . Ideally 1500mg  per day.    Personal Goal #3 Reduce saturated fat, less than 12g per day. Replace bad fats for more heart healthy fats.    Comments Patient drinking mostly water, ~  48-64oz daily. 1 can of soda per day. He has been gaining weight since he has become less physically active. He is trying to lose some weight with diet. He reports he rarely eats breakfast or lunch and will usually eat dinner. Spoke with him about the importance of spreading his calories and protein intake with fiber and healthy fats throughout the day. Encouraged him to log on myfitnesspal for a free resource to assess if he is under eating. Reviewed Mediterranean diet handout, educated on types of fats, sources, and how to read labels. Spoke about cutting back on sodium, he has already stopped using salt for cooking and seasoning. Reminded him to read labels as well, since much of sodium intake can come from processed and packaged foods. Built out several meals and snacks with foods he likes and will eat focusing on nutrient dense foods that will help him meet his protein, fiber, and calorie goals while keeping quantity of food low.      Intervention Plan   Intervention Prescribe, educate and counsel regarding individualized specific dietary modifications aiming towards targeted core components such as weight, hypertension, lipid management, diabetes, heart failure and other comorbidities.;Nutrition handout(s) given to patient.    Expected Outcomes Short Term Goal: Understand basic principles of dietary content, such as calories, fat, sodium, cholesterol and nutrients.;Short Term Goal: A plan has been developed with personal nutrition goals  set during dietitian appointment.;Long Term Goal: Adherence to prescribed nutrition plan.             Nutrition Assessments:  MEDIFICTS Score Key: >=70 Need to make dietary changes  40-70 Heart Healthy Diet <= 40 Therapeutic Level Cholesterol Diet  Flowsheet Row Cardiac Rehab from 05/24/2023 in [facility]  Picture Your Plate Total Score on Discharge 64      Picture Your Plate Scores: <16 Unhealthy dietary pattern with much room for improvement. 41-50 Dietary pattern unlikely to meet recommendations for good health and room for improvement. 51-60 More healthful dietary pattern, with some room for improvement.  >60 Healthy dietary pattern, although there may be some specific behaviors that could be improved.    Nutrition Goals Re-Evaluation:  Nutrition Goals Re-Evaluation     Row Name 02/06/23 1131 02/27/23 1138 04/10/23 1136 04/17/23 1139 05/24/23 1133     Goals   Nutrition Goal Eat 15-30gProtein and 30-60gCarbs at each meal. -- -- -- --   Comment Corey Barnes reports he has had a hard time working on nutrition goals due to limited income and what he can afford to buy. He states that he tries his best to incorporate fruits, vegetables, and protein when he can afford it. He reports he has done better eating more food throughout the day and not just at one meal. He is reading labels and trying to limit sodium and saturated fat. Discussed some easy cheaper meal ideas to try and help him include more food without exceeding sodium and saturated fat limits. Corey Barnes is trying to continue to adhere to the guidelines set by the RD. He has been gaining weight but still only eating his one big meal daily, so he is thinking about mentioning that to his doctor. He is trying to stick to low sodium options. He has decreased his caffeine intake to one soda most days Corey Barnes is working on making better [employer], reading labels and working on keeping sodium controlled. Spoke with him  about eating smaller meals throughout the day, rather than 1 large meal. Encouraged him to keep  working on these changes as they can take time to become more habitiual. Corey Barnes is eating ~10meals per day now, though finances are a worry for him. Says healthy foods can be expensive. Provided some good food ideas that are cheap to try. But food is expensive, will continue to monitor and support. he reports he is meet his protein goals.   Expected Outcome Short: be mindful of what he is eating and try to encorporate fresh foods when budget allows. Try to work on reading food labels.  Long: be able to maintain heart healthy diet as budget allows. STG: eat 2-3 meals per day , include healthy fats and low sodium frozen meals id needed. LTG: maintain heart healthy lifestyle changes Short: look at options for meal planning with doctor. Long: independently manage heart healthy diet STG: Eat smaller more frequent meals. LTG: independently manage heart healthy diet. STG: Eat smaller more frequent meals/snacks. LTG: independently manage heart healthy diet.     Personal Goal #2 Re-Evaluation   Personal Goal #2 Read labels and reduce sodium intake to below 2300mg . Ideally 1500mg  per day. -- -- -- --     Personal Goal #3 Re-Evaluation   Personal Goal #3 Reduce saturated fat, less than 12g per day. Replace bad fats for more heart healthy fats. -- -- -- --            Nutrition Goals Discharge (Final Nutrition Goals Re-Evaluation):  Nutrition Goals Re-Evaluation - 05/24/23 1133       Goals   Comment Corey Barnes is eating ~44meals per day now, though finances are a worry for him. Says healthy foods can be expensive. Provided some good food ideas that are cheap to try. But food is expensive, will continue to monitor and support. he reports he is meet his protein goals.    Expected Outcome STG: Eat smaller more frequent meals/snacks. LTG: independently manage heart healthy diet.             Psychosocial: Target  Goals: Acknowledge presence or absence of significant depression and/or stress, maximize coping skills, provide positive support system. Participant is able to verbalize types and ability to use techniques and skills needed for reducing stress and depression.   Education: Stress, Anxiety, and Depression - Group verbal and visual presentation to define topics covered.  Reviews how body is impacted by stress, anxiety, and depression.  Also discusses healthy ways to reduce stress and to treat/manage anxiety and depression.  Written material given at graduation.   Education: Sleep Hygiene -Provides group verbal and written instruction about how sleep can affect your health.  Define sleep hygiene, discuss sleep cycles and impact of sleep habits. Review good sleep hygiene tips.    Initial Review & Psychosocial Screening:  Initial Psych Review & Screening - 01/12/23 1050       Initial Review   Current issues with Current Anxiety/Panic;Current Stress Concerns    Source of Stress Concerns Financial    Comments out of work  a year working on disability.   prescibed meds for anxiety.  is working on trting to redirect thoughts to decrease anxiety      Family Dynamics   Good Support System? Yes   oldest kid, Mom, some friends     Barriers   Psychosocial barriers to participate in program There are no identifiable barriers or psychosocial needs.      Screening Interventions   Interventions Encouraged to exercise    Expected Outcomes Short Term goal: Utilizing psychosocial counselor, staff and physician  to assist with identification of specific Stressors or current issues interfering with healing process. Setting desired goal for each stressor or current issue identified.;Long Term Goal: Stressors or current issues are controlled or eliminated.;Short Term goal: Identification and review with participant of any Quality of Life or Depression concerns found by scoring the questionnaire.;Long Term goal: The  participant improves quality of Life and PHQ9 Scores as seen by post scores and/or verbalization of changes             Quality of Life Scores:   Quality of Life - 05/24/23 1459       Quality of Life   Select Quality of Life      Quality of Life Scores   Health/Function Post 10.7 %    Socioeconomic Post 15.63 %    Psych/Spiritual Post 12.86 %    Family Post 21.6 %    GLOBAL Post 13.81 %            Scores of 19 and below usually indicate a poorer quality of life in these areas.  A difference of  2-3 points is a clinically meaningful difference.  A difference of 2-3 points in the total score of the Quality of Life Index has been associated with significant improvement in overall quality of life, self-image, physical symptoms, and general health in studies assessing change in quality of life.  PHQ-9: Review Flowsheet  More data exists      05/24/2023 04/17/2023 04/10/2023 02/27/2023 02/06/2023  Depression screen PHQ 2/9  Decreased Interest 2 2 2 2 1 2   Down, Depressed, Hopeless 2 3 3 1 1 1   PHQ - 2 Score 4 5 5 3 2 3   Altered sleeping 3 3 3 3 3 3   Tired, decreased energy 2 2 1  - 2  Change in appetite 1 1 2  0 0 2  Feeling bad or failure about yourself  3 2 3 2 2 3   Trouble concentrating 2 1 1 1 1 1   Moving slowly or fidgety/restless 0 1 1 1  0 0  Suicidal thoughts 0 0 0 0 0 0  PHQ-9 Score 15 13 17 11 8 14   Difficult doing work/chores Somewhat difficult Somewhat difficult Somewhat difficult Somewhat difficult Somewhat difficult Somewhat difficult    Details       Multiple values from one day are sorted in reverse-chronological order        Interpretation of Total Score  Total Score Depression Severity:  1-4 = Minimal depression, 5-9 = Mild depression, 10-14 = Moderate depression, 15-19 = Moderately severe depression, 20-27 = Severe depression   Psychosocial Evaluation and Intervention:  Psychosocial Evaluation - 01/12/23 1109       Psychosocial Evaluation &  Interventions   Interventions Encouraged to exercise with the program and follow exercise prescription;Relaxation education    Comments There are no barriers to attending the program. His Mom lives with him. She and his older son and smoe friends are his support.  He does have a history of anxiety and has meds to help as needed. He stated that he tries to think about other things to decrease the anxiety. He does have finacial concern as he has been out of work about a year. He is working with a Clinical research associate to get diasability.  He is ready to start the program and work on feeling better    Expected Outcomes STG attend all sessions as scheduled, continue to work on methods to decrease anxiety response.  LTG able to manage anxiety  symptoms    Continue Psychosocial Services  Follow up required by staff             Psychosocial Re-Evaluation:  Psychosocial Re-Evaluation     Row Name 02/06/23 1138 02/27/23 1134 04/10/23 1130 04/17/23 1145 05/24/23 1130     Psychosocial Re-Evaluation   Current issues with Current Anxiety/Panic;Current Stress Concerns Current Anxiety/Panic;Current Stress Concerns;Current Sleep Concerns Current Stress Concerns Current Anxiety/Panic Current Anxiety/Panic;Current Sleep Concerns   Comments Patient reevaluated with another PHQ questionaire. His score went down from a 15 to a 14. He struggles with the fact that most of his hobbies require being physical and having mobility, with which he is limiited. He also has financial stressors currently. Continues with prescribed treatment. Reevaluated PHQ questionaire, he improved from 14 to 8. He still struggles with losing hobbies due to cost of disability. Struggles to sleep most nights, reports anxiety is a big barrier to getting to sleep. He take his meds and has a psych evaluation coming up in a month or so. PHQ went from 8 to 11 this check. He mentions he just has been feeling more down these last few weeks, but mentions he spent time  outside yesterday for the first time in a while which made him feel better. After his knee injury at work in 2018, he mentions that he now can't do any of his hobbies he used to enjoy. We discussed swimming or water aerobics and he is going to think about trying that. His kids are his main support system and continue to help keep him motivated to work on his health. He doesn't really feel a difference with his mental health since starting the program, but notes that he wants to continue exercising after he graduates because he knows its good for him and will feel the benefit eventually. He notes he feels more jittery than usual and is going to talk to his doctor about a refill on his anxiety medications. He has had his physical to determine his disability standing and is waiting for the results for his disability coverage PHQ went from 11 to 17 since last taken ~1week ago. He has been feeling more anxiety as of late. He does have a Dr appointment today to address the anxiety, he is expecting to get medication to support him with this. He has a good support system with his family. He does feel better after exercising or getting fresh air, especially when the weather is nice. Corey Barnes continues to work with his medical team on his mental health. He reports he was diagnosed with PTSD and depression recently and was given some medication. He hasnt been sleeping well for quite some time. He was given some sleep medication but he hasnt taken any, reports dislike of medication. Encouraged and supported him to focus on getting good sleep, if he is uncomfortable taking the precribed medication then he should speak with her Dr about it because sleep is important.   Expected Outcomes Short: continue to exercise to try to gain strength and mobility so he can start doing hobbies he previously enjoyed. Long: maintian good mental health habits. STG: focus on improving strength, take meds as prescribed. LTG: achieve and maintain  positive outlook on health and daily life Short: talk with doctor about anxiety medication Long: find hobbies that he is able to do with his knee injury STG: Continue to exercise without hurting knee, speak with Dr about anxiety medication. LTG: look to find hobbies he is able to do with  knee injury STG: practice good sleep hygiene and communicate with Dr. Linzie Rickers: look to find hobbies he is able to do with knee injury   Interventions Encouraged to attend Cardiac Rehabilitation for the exercise Encouraged to attend Cardiac Rehabilitation for the exercise Stress management education;Relaxation education Encouraged to attend Cardiac Rehabilitation for the exercise Encouraged to attend Cardiac Rehabilitation for the exercise   Continue Psychosocial Services  Follow up required by staff Follow up required by staff Follow up required by staff Follow up required by staff Follow up required by staff     Initial Review   Source of Stress Concerns -- -- Unable to perform yard/household activities -- --            Psychosocial Discharge (Final Psychosocial Re-Evaluation):  Psychosocial Re-Evaluation - 05/24/23 1130       Psychosocial Re-Evaluation   Current issues with Current Anxiety/Panic;Current Sleep Concerns    Comments Corey Barnes continues to work with his medical team on his mental health. He reports he was diagnosed with PTSD and depression recently and was given some medication. He hasnt been sleeping well for quite some time. He was given some sleep medication but he hasnt taken any, reports dislike of medication. Encouraged and supported him to focus on getting good sleep, if he is uncomfortable taking the precribed medication then he should speak with her Dr about it because sleep is important.    Expected Outcomes STG: practice good sleep hygiene and communicate with Dr. Linzie Rickers: look to find hobbies he is able to do with knee injury    Interventions Encouraged to attend Cardiac Rehabilitation for the  exercise    Continue Psychosocial Services  Follow up required by staff             Vocational Rehabilitation: Provide vocational rehab assistance to qualifying candidates.   Vocational Rehab Evaluation & Intervention:  Vocational Rehab - 04/10/23 1130       Initial Vocational Rehab Evaluation & Intervention   Assessment shows need for Vocational Rehabilitation No             Education: Education Goals: Education classes will be provided on a variety of topics geared toward better understanding of heart health and risk factor modification. Participant will state understanding/return demonstration of topics presented as noted by education test scores.  Learning Barriers/Preferences:   General Cardiac Education Topics:  AED/CPR: - Group verbal and written instruction with the use of models to demonstrate the basic use of the AED with the basic ABC's of resuscitation.   Anatomy and Cardiac Procedures: - Group verbal and visual presentation and models provide information about basic cardiac anatomy and function. Reviews the testing methods done to diagnose heart disease and the outcomes of the test results. Describes the treatment choices: Medical Management, Angioplasty, or Coronary Bypass Surgery for treating various heart conditions including Myocardial Infarction, Angina, Valve Disease, and Cardiac Arrhythmias.  Written material given at graduation.   Medication Safety: - Group verbal and visual instruction to review commonly prescribed medications for heart and lung disease. Reviews the medication, class of the drug, and side effects. Includes the steps to properly store meds and maintain the prescription regimen.  Written material given at graduation.   Intimacy: - Group verbal instruction through game format to discuss how heart and lung disease can affect sexual intimacy. Written material given at graduation..   Know Your Numbers and Heart Failure: - Group verbal  and visual instruction to discuss disease risk factors for cardiac and pulmonary  disease and treatment options.  Reviews associated critical values for Overweight/Obesity, Hypertension, Cholesterol, and Diabetes.  Discusses basics of heart failure: signs/symptoms and treatments.  Introduces Heart Failure Zone chart for action plan for heart failure.  Written material given at graduation. Flowsheet Row Cardiac Rehab from 01/16/2023 in [facility] Cardiac and Pulmonary Rehab  Education need identified 01/16/23       Infection Prevention: - Provides verbal and written material to individual with discussion of infection control including proper hand washing and proper equipment cleaning during exercise session. Flowsheet Row Cardiac Rehab from 01/16/2023 in [facility] Cardiac and Pulmonary Rehab  Date 01/16/23  Educator NT  Instruction Review Code 1- Verbalizes Understanding       Falls Prevention: - Provides verbal and written material to individual with discussion of falls prevention and safety. Flowsheet Row Cardiac Rehab from 01/16/2023 in [facility] Cardiac and Pulmonary Rehab  Date 01/12/23  Educator SB  Instruction Review Code 1- Verbalizes Understanding       Other: -Provides group and verbal instruction on various topics (see comments)   Knowledge Questionnaire Score:  Knowledge Questionnaire Score - 05/24/23 1503       Knowledge Questionnaire Score   Post Score 23/26             Core Components/Risk Factors/Patient Goals at Admission:  Personal Goals and Risk Factors at Admission - 01/12/23 1138       Core Components/Risk Factors/Patient Goals on Admission   Number of packs per day Corey Barnes is a former tobacco user. Intervention for tobacco cessation was provided at the initial medical review. He was asked about when he  quit and reported 11/14/2022 right before his surgery . Patient was advised and educated about tobacco cessation using combination therapy, tobacco cessation  classes, quit line, and quit smoking apps. Patient demonstrated understanding of this material. Staff will continue to provide encouragement and follow up with the patient throughout the progra             Education:Diabetes - Individual verbal and written instruction to review signs/symptoms of diabetes, desired ranges of glucose level fasting, after meals and with exercise. Acknowledge that pre and post exercise glucose checks will be done for 3 sessions at entry of program.   Core Components/Risk Factors/Patient Goals Review:   Goals and Risk Factor Review     Row Name 02/06/23 1128 02/27/23 1140 04/10/23 1124 04/17/23 1136 05/24/23 1135     Core Components/Risk Factors/Patient Goals Review   Personal Goals Review Tobacco Cessation;Hypertension Tobacco Cessation;Hypertension Weight Management/Obesity;Hypertension;Lipids Hypertension Hypertension   Review Corey Barnes reports that he is still tobacco free. He also stated that he takes all his BP meds and that he does have a wrist blood pressure monitor at home that he uses to monitor is BP. He had an elevated reading a while ago and ended up in the ED and has a follow up with his cardiologist tomorrow from this ED vist. He was encouraged to bring in his wrist monitor to class to have us  check its accuacy. He reports he is still tobacco free. Commended him on this goal. He is taking his medications, but misplaced his wrist BP monitor. spoke with him about looking for it and if found bringing to Rehab to measure against ours to ensure accuracy. Met with his cardiolgist, which he reports went well and that Dr is happy with his progress, will have his cholesterol labs checked next cardiolist appointment Corey Barnes reports he has been enjoying the program so far. His blood  pressure readings at home have been higher than in the program, so he is planning on bringing his home bp monitor in so we can compare readings. He has been tolerating the repatha well which  pleases him since he can't tolerate statins. He will have his cholesterol labs checked in August at his next appointment. He is concerned that he is still gaining weight even though he is not eating more food. Discussed signs of fluid retention and so far he is not having any. He is thinking about having a conversation with his doctor related to his weight. Corey Barnes has been montioring her heart rate, which he feels has been higher than normal, has spoken with her cardiologist. He hasnt been able to find his blood pressure cuff, so hasnt been checking his blood pressure at home. Encouraged him to bring it in to compare if he finds it, and if he cant find it soon, look in to a replacement as its important he can check his blood pressure. Corey Barnes did find his blood pressure cuff, reports it reads ~20points higher than the rehab BP cuffs. Reminded him to bring it in and see if there can be any calibrations made or to look for a new more accurate one.   Expected Outcomes Short: bring in home BP cuff to have it checked for accuacy. Long: continue to monitor BP at home and maintain tobacco free status. STG: find BP cuff and bring in, take meds and communicate with medical team as needed. LTG: Check BP at home and maintain tobacco free lifestyle Short: bring in bp cuff to check accuracy and continue to work on cholesterol management. Long: independenlty manage risk factors STG: find or replace home blood pressure cuff. LTG: check blood pressure independently STG: bring in BP cuff. LTG: check blood pressure independently            Core Components/Risk Factors/Patient Goals at Discharge (Final Review):   Goals and Risk Factor Review - 05/24/23 1135       Core Components/Risk Factors/Patient Goals Review   Personal Goals Review Hypertension    Review Corey Barnes did find his blood pressure cuff, reports it reads ~20points higher than the rehab BP cuffs. Reminded him to bring it in and see if there can be any calibrations  made or to look for a new more accurate one.    Expected Outcomes STG: bring in BP cuff. LTG: check blood pressure independently             ITP Comments:  ITP Comments     Row Name 01/12/23 1102 01/16/23 1551 01/18/23 0942 01/18/23 1134 02/15/23 1250   ITP Comments Virtual orientation call completed today. he has an appointment on Date: 01/16/2023  for EP eval and gym Orientation.  Documentation of diagnosis can be found in [facility] Date: 10/30/204 . Completed and gym orientation. Initial ITP created and sent for review to Dr. Firman Hughes, Medical Director. 30 Day review completed. Medical Director ITP review done, changes made as directed, and signed approval by Medical Director.   new to program First full day of exercise!  Patient was oriented to gym and equipment including functions, settings, policies, and procedures.  Patient's individual exercise prescription and treatment plan were reviewed.  All starting workloads were established based on the results of the 6 minute walk test done at initial orientation visit.  The plan for exercise progression was also introduced and progression will be customized based on patient's performance and goals. 30 Day review  completed. Medical Director ITP review done, changes made as directed, and signed approval by Medical Director.    new to program    Row Name 03/15/23 0731 04/12/23 0850 05/10/23 0735 06/07/23 1153 06/12/23 1132   ITP Comments 30 Day review completed. Medical Director ITP review done, changes made as directed, and signed approval by Medical Director. 30 Day review completed. Medical Director ITP review done, changes made as directed, and signed approval by Medical Director. 30 Day review completed. Medical Director ITP review done, changes made as directed, and signed approval by Medical Director. 30 Day review completed. Medical Director ITP review done, changes made as directed, and signed approval by Medical Director. Corey Barnes graduated  today from  rehab with 36 sessions completed.  Details of the patient's exercise prescription and what He needs to do in order to continue the prescription and progress were discussed with patient.  Patient was given a copy of prescription and goals.  Patient verbalized understanding. Corey Barnes plans to continue to exercise by walking at home.            Comments: Discharge ITP

## 2023-06-12 NOTE — Progress Notes (Signed)
 Discharge Note for  Corey Barnes     1970/07/30         [name] graduated today from  rehab with 36 sessions completed.  Details of the patient's exercise prescription and what He needs to do in order to continue the prescription and progress were discussed with patient.  Patient was given a copy of prescription and goals.  Patient verbalized understanding. [name] plans to continue to exercise by walking at home.    6 Minute Walk     Row Name 01/16/23 1610 05/22/23 1127       6 Minute Walk   Phase Initial Discharge    Distance 700 feet 865 feet    Distance % Change -- 23.6 %    Distance Feet Change -- 165 ft    Walk Time 6 minutes 6 minutes    # of Rest Breaks 0 0    MPH 1.33 1.64    METS 3.07 2.9    RPE 9 13    Perceived Dyspnea  2 1    VO2 Peak 10.74 10.2    Symptoms Yes (comment) No    Comments knee pain 8/10, limp Bilateral knee pain, Right hip pain, limp    Resting HR 98 bpm 82 bpm    Resting BP 122/82 102/70    Resting Oxygen Saturation  97 % 97 %    Exercise Oxygen Saturation  during 6 min walk 97 % 97 %    Max Ex. HR 114 bpm 97 bpm    Max Ex. BP 136/86 122/64    2 Minute Post BP 120/78 --

## 2023-06-12 NOTE — Progress Notes (Signed)
 Daily Session Note  Patient Details  Name: Corey Barnes MRN: [id number] Date of Birth: October 18, 1970 Referring Provider:   Flowsheet Row Cardiac Rehab from 01/16/2023 in [facility] Cardiac and Pulmonary Rehab  Referring Provider Dr. Teddie Favre, MD       Encounter Date: 06/12/2023  Check In:  Session Check In - 06/12/23 1131       Check-In   Supervising physician immediately available to respond to emergencies See telemetry face sheet for immediately available ER MD    Location [facility]-Cardiac & Pulmonary Rehab    Staff Present Maud Sorenson, RN, BSN, CCRP;Joseph Hood RCP,RRT,BSRT;[employer], Exercise Physiologist;Kelly [employer], ACSM CEP, Exercise Physiologist;Margaret Best, MS, Exercise Physiologist    Virtual Visit No    Medication changes reported     No    Fall or balance concerns reported    No    Warm-up and Cool-down Performed on first and last piece of equipment    Resistance Training Performed Yes    VAD Patient? No    PAD/SET Patient? No      Pain Assessment   Currently in Pain? No/denies                Social History   Tobacco Use  Smoking Status Former   Current packs/day: 0.50   Average packs/day: 0.5 packs/day for 32.0 years (16.0 ttl pk-yrs)   Types: Cigarettes  Smokeless Tobacco Never    Goals Met:  Independence with exercise equipment Exercise tolerated well No report of concerns or symptoms today  Goals Unmet:  Not Applicable  Comments: Pt able to follow exercise prescription today without complaint.    Enoch graduated today from  rehab with 36 sessions completed.  Details of the patient's exercise prescription and what He needs to do in order to continue the prescription and progress were discussed with patient.  Patient was given a copy of prescription and goals.  Patient verbalized understanding. [place] plans to continue to exercise by walking at home.    Dr. Firman Hughes is Medical Director for [facility] Cardiac Rehabilitation.   Dr. Fuad Aleskerov is Medical Director for [facility] Pulmonary Rehabilitation.

## 2023-06-14 ENCOUNTER — Encounter

## 2023-07-31 ENCOUNTER — Telehealth: Payer: Self-pay

## 2023-07-31 DIAGNOSIS — F411 Generalized anxiety disorder: Secondary | ICD-10-CM

## 2023-07-31 DIAGNOSIS — I10 Essential (primary) hypertension: Secondary | ICD-10-CM

## 2023-07-31 DIAGNOSIS — F419 Anxiety disorder, unspecified: Secondary | ICD-10-CM

## 2023-07-31 NOTE — Progress Notes (Signed)
 Complex Care Management Note  Care Guide Note 07/31/2023 Name: VALEN GILLISON MRN: [id number] DOB: 05/15/1970  Elsie GORMAN Gilding is a 53 y.o. year old male who sees [facility] for [facility]. I reached out to Elsie GORMAN Gilding by phone today to offer complex care management services.  Mr. Renne was given information about Complex Care Management services today including:   The Complex Care Management services include support from the care team which includes your Nurse Care Manager, Clinical Social Worker, or Pharmacist.  The Complex Care Management team is here to help remove barriers to the health concerns and goals most important to you. Complex Care Management services are voluntary, and the patient may decline or stop services at any time by request to their care team member.   Complex Care Management Consent Status: Patient did not agree to participate in complex care management services at this time.  Follow up plan:    Encounter Outcome:  Patient Refused  Jeoffrey Buffalo , RMA     [facility]  [facility] Guide  Direct Dial: [phone]  Website: [email or web address]
